# Patient Record
Sex: Female | Born: 1940 | Race: White | Hispanic: No | Smoking: Current every day smoker
Health system: Southern US, Community
[De-identification: ages and names within clinical notes are randomized; demographics above are authoritative.]

## PROBLEM LIST (undated history)

## (undated) DIAGNOSIS — I10 Essential (primary) hypertension: Secondary | ICD-10-CM

## (undated) DIAGNOSIS — M1990 Unspecified osteoarthritis, unspecified site: Secondary | ICD-10-CM

## (undated) DIAGNOSIS — K219 Gastro-esophageal reflux disease without esophagitis: Secondary | ICD-10-CM

## (undated) DIAGNOSIS — IMO0001 Reserved for inherently not codable concepts without codable children: Secondary | ICD-10-CM

## (undated) DIAGNOSIS — F419 Anxiety disorder, unspecified: Secondary | ICD-10-CM

## (undated) DIAGNOSIS — Z720 Tobacco use: Secondary | ICD-10-CM

## (undated) DIAGNOSIS — I499 Cardiac arrhythmia, unspecified: Secondary | ICD-10-CM

## (undated) DIAGNOSIS — M549 Dorsalgia, unspecified: Secondary | ICD-10-CM

## (undated) DIAGNOSIS — Z9289 Personal history of other medical treatment: Secondary | ICD-10-CM

## (undated) HISTORY — PX: OTHER SURGICAL HISTORY: SHX169

## (undated) HISTORY — PX: EYE SURGERY: SHX253

## (undated) HISTORY — PX: TONSILLECTOMY: SUR1361

---

## 2002-12-30 ENCOUNTER — Encounter: Admission: RE | Admit: 2002-12-30 | Discharge: 2002-12-30 | Payer: Self-pay | Admitting: *Deleted

## 2002-12-30 ENCOUNTER — Encounter: Payer: Self-pay | Admitting: *Deleted

## 2007-05-30 ENCOUNTER — Other Ambulatory Visit: Admission: RE | Admit: 2007-05-30 | Discharge: 2007-05-30 | Payer: Self-pay | Admitting: Family Medicine

## 2011-02-01 ENCOUNTER — Encounter: Payer: Self-pay | Admitting: Cardiology

## 2011-02-01 NOTE — Progress Notes (Signed)
HPI: 70 yo female for evaluation of   No current outpatient prescriptions on file.    Allergies not on file  No past medical history on file.  No past surgical history on file.  History   Social History  . Marital Status: Divorced    Spouse Name: N/A    Number of Children: N/A  . Years of Education: N/A   Occupational History  . Not on file.   Social History Main Topics  . Smoking status: Not on file  . Smokeless tobacco: Not on file  . Alcohol Use: Not on file  . Drug Use: Not on file  . Sexually Active: Not on file   Other Topics Concern  . Not on file   Social History Narrative  . No narrative on file    No family history on file.  ROS: no fevers or chills, productive cough, hemoptysis, dysphasia, odynophagia, melena, hematochezia, dysuria, hematuria, rash, seizure activity, orthopnea, PND, pedal edema, claudication. Remaining systems are negative.  Physical Exam: General:  Well developed/well nourished in NAD Skin warm/dry Patient not depressed No peripheral clubbing Back-normal HEENT-normal/normal eyelids Neck supple/normal carotid upstroke bilaterally; no bruits; no JVD; no thyromegaly chest - CTA/ normal expansion CV - RRR/normal S1 and S2; no murmurs, rubs or gallops;  PMI nondisplaced Abdomen -NT/ND, no HSM, no mass, + bowel sounds, no bruit 2+ femoral pulses, no bruits Ext-no edema, chords, 2+ DP Neuro-grossly nonfocal  ECG      This encounter was created in error - please disregard.

## 2011-02-21 ENCOUNTER — Encounter: Payer: Self-pay | Admitting: Cardiology

## 2011-08-25 ENCOUNTER — Other Ambulatory Visit: Payer: Self-pay | Admitting: Family Medicine

## 2011-08-25 DIAGNOSIS — Z1231 Encounter for screening mammogram for malignant neoplasm of breast: Secondary | ICD-10-CM

## 2011-12-20 ENCOUNTER — Emergency Department (HOSPITAL_COMMUNITY): Payer: Medicare HMO

## 2011-12-20 ENCOUNTER — Emergency Department (HOSPITAL_COMMUNITY)
Admission: EM | Admit: 2011-12-20 | Discharge: 2011-12-21 | Disposition: A | Discharge status: Home / Self Care | Payer: Medicare HMO | Attending: Emergency Medicine | Admitting: Emergency Medicine

## 2011-12-20 ENCOUNTER — Encounter (HOSPITAL_COMMUNITY): Payer: Self-pay | Admitting: Emergency Medicine

## 2011-12-20 ENCOUNTER — Other Ambulatory Visit: Payer: Self-pay

## 2011-12-20 DIAGNOSIS — R1013 Epigastric pain: Secondary | ICD-10-CM | POA: Insufficient documentation

## 2011-12-20 DIAGNOSIS — Z7982 Long term (current) use of aspirin: Secondary | ICD-10-CM | POA: Insufficient documentation

## 2011-12-20 DIAGNOSIS — Z79899 Other long term (current) drug therapy: Secondary | ICD-10-CM | POA: Insufficient documentation

## 2011-12-20 DIAGNOSIS — R079 Chest pain, unspecified: Secondary | ICD-10-CM | POA: Insufficient documentation

## 2011-12-20 DIAGNOSIS — IMO0001 Reserved for inherently not codable concepts without codable children: Secondary | ICD-10-CM | POA: Insufficient documentation

## 2011-12-20 DIAGNOSIS — F17200 Nicotine dependence, unspecified, uncomplicated: Secondary | ICD-10-CM | POA: Insufficient documentation

## 2011-12-20 DIAGNOSIS — R0602 Shortness of breath: Secondary | ICD-10-CM | POA: Insufficient documentation

## 2011-12-20 HISTORY — DX: Reserved for inherently not codable concepts without codable children: IMO0001

## 2011-12-20 LAB — CBC
Hemoglobin: 13.3 g/dL (ref 12.0–15.0)
MCHC: 32.2 g/dL (ref 30.0–36.0)
Platelets: 264 10*3/uL (ref 150–400)

## 2011-12-20 LAB — BASIC METABOLIC PANEL
GFR calc Af Amer: >90 mL/min (ref >90)
GFR calc non Af Amer: 83 mL/min — ABNORMAL LOW (ref >90)
Glucose, Bld: 109 mg/dL — ABNORMAL HIGH (ref 70–99)
Potassium: 3.5 mEq/L (ref 3.5–5.1)
Sodium: 141 mEq/L (ref 135–145)

## 2011-12-20 LAB — POCT I-STAT TROPONIN I: Troponin i, poc: 0.01 ng/mL (ref 0.00–0.08)

## 2011-12-20 NOTE — ED Notes (Addendum)
Patient complaining of chest pain that started this evening; pain began in epigastric region and radiated upwards; patient was evaluated by EMS (patient felt that she would rather drive to the hospital). Patient describes pain as a "pressure"; reports shortness of breath; denies other associated symptoms.  Chest pain is now in left side of chest.  Patient denies cardiac history, though had a stress test done 6 years ago.  EKG done in triage.

## 2011-12-21 MED ORDER — ASPIRIN 81 MG PO CHEW
324.0000 mg | CHEWABLE_TABLET | Freq: Once | ORAL | Status: AC
  Administered 2011-12-21: 324 mg via ORAL
  Filled 2011-12-21: qty 4

## 2011-12-21 NOTE — Discharge Instructions (Signed)
Your EKG, and blood tests do not show any signs of a heart attack or irregular heartbeat.  Take an aspirin daily.  Stop smoking.  Call your physician.  This morning after 8:30 to arrange for reevaluation in the office.  Return for worse or uncontrolled symptoms.

## 2011-12-21 NOTE — ED Provider Notes (Addendum)
History     CSN: [id number]  Arrival date & time 12/20/11  2212   First MD Initiated Contact with Patient 12/21/11 0006      Chief Complaint  Patient presents with  . Chest Pain    (Consider location/radiation/quality/duration/timing/severity/associated sxs/prior treatment) Patient is a 71 y.o. female presenting with chest pain. The history is provided by the patient.  Chest Pain Primary symptoms include shortness of breath. Pertinent negatives for primary symptoms include no fever, no cough, no nausea and no vomiting.  Pertinent negatives for associated symptoms include no diaphoresis.    the patient is a 71 year old, female, who smokes cigarettes, but has no past medical history, who presents to emergency department complaining of epigastric pain that began while she was eating dinner.  She said it was so severe that she had shortness of breath, as well.  She denies nausea, vomiting, or diaphoresis.  She has not had similar symptoms in the past.  She states that she has stairs in her house, when she goes up and down the stairs.  She does not get chest pain, or shortness of breath.  She also walks about 3 times day and does not get chest pain with ambulation.  She called the ambulance.  They gave her oxygen and her pain dissipated.  Presently, she just has mild soreness in her chest.  He denies any other symptoms.  Past Medical History  Diagnosis Date  . No significant past medical history     History reviewed. No pertinent past surgical history.  History reviewed. No pertinent family history.  History  Substance Use Topics  . Smoking status: Current Everyday Smoker -- 0.5 packs/day  . Smokeless tobacco: Not on file  . Alcohol Use: Yes     Occassional Use    OB History    Grav Para Term Preterm Abortions TAB SAB Ect Mult Living                  Review of Systems  Constitutional: Negative for fever, chills and diaphoresis.  Respiratory: Positive for shortness of breath.  Negative for cough.   Cardiovascular: Positive for chest pain. Negative for leg swelling.  Gastrointestinal: Negative for nausea and vomiting.  All other systems reviewed and are negative.    Allergies  Sulfa antibiotics  Home Medications   Current Outpatient Rx  Name Route Sig Dispense Refill  . ASPIRIN EC 81 MG PO TBEC Oral Take 81 mg by mouth daily.    Marland Kitchen LANSOPRAZOLE 30 MG PO CPDR Oral Take 30 mg by mouth daily.      BP 139/69  Pulse 77  Temp(Src) 97.9 F (36.6 C) (Oral)  Resp 16  SpO2 98%  Physical Exam  Vitals reviewed. Constitutional: She is oriented to person, place, and time. She appears well-developed and well-nourished.  HENT:  Head: Normocephalic and atraumatic.  Eyes: Pupils are equal, round, and reactive to light.  Neck: Normal range of motion.  Cardiovascular: Normal rate, regular rhythm and normal heart sounds.   No murmur heard. Pulmonary/Chest: Effort normal and breath sounds normal. No respiratory distress. She has no wheezes. She has no rales.  Abdominal: Soft. She exhibits no distension and no mass. There is no tenderness. There is no rebound and no guarding.  Musculoskeletal: Normal range of motion. She exhibits no edema and no tenderness.  Neurological: She is alert and oriented to person, place, and time. No cranial nerve deficit.  Skin: Skin is warm and dry. No rash noted. No erythema.  Psychiatric: She has a normal mood and affect. Her behavior is normal.    ED Course  Procedures (including critical care time) 71 year old, female, who smokes cigarettes, presents to emergency department complaining of postprandial epigastric pain and shortness of breath.  Due to the severity of the pain.  She is pain-free now and has a negative EKG, and cardiac enzymes.  I do not think her symptoms are attributable to her heart.  I spoke with the physician on call act.  [facility].  She agrees that I can discharge.  The patient and have her followup in  the office tomorrow morning.  I explained this to the patient and her daughter and they agree with the plan to  Labs Reviewed  CBC - Abnormal; Notable for the following:    WBC 11.9 (*)    All other components within normal limits  BASIC METABOLIC PANEL - Abnormal; Notable for the following:    Glucose, Bld 109 (*)    GFR calc non Af Amer 83 (*)    All other components within normal limits  POCT I-STAT TROPONIN I   Dg Chest 2 View  12/20/2011  *RADIOLOGY REPORT*  Clinical Data: Left-sided chest pain.  CHEST - 2 VIEW  Comparison: None.  Findings: Heart size and vascularity are normal and the lungs are clear.  Slight thoracolumbar scoliosis.  No acute osseous abnormality.  IMPRESSION: No acute disease.  Original Report Authenticated By: Gwynn Burly, M.D.     No diagnosis found.   Date: 12/20/2011  Rate: 75  Rhythm: normal sinus rhythm  QRS Axis: normal  Intervals: normal  ST/T Wave abnormalities: normal  Conduction Disutrbances: none  Narrative Interpretation:   Old EKG Reviewed:  1:00 AM Spoke with Dr. Clyde Canterbury.  She agrees with plan for f/u in office.   MDM  Postprandial epigastric pain in a 71 year old smoker with no evidence of acute coronary syndrome.  On her EKG or blood tests.  She is very active and has not felt any chest pain.  I have low suspicion that this is related to her heart.  She will followup in the office with her primary care physician, tomorrow.        Nicholes Stairs, MD 12/21/11 0100  Nicholes Stairs, MD 12/21/11 1308

## 2012-01-01 ENCOUNTER — Other Ambulatory Visit: Payer: Self-pay | Admitting: Gastroenterology

## 2012-01-08 ENCOUNTER — Ambulatory Visit
Admission: RE | Admit: 2012-01-08 | Discharge: 2012-01-08 | Disposition: A | Discharge status: Home / Self Care | Payer: Medicare HMO | Source: Ambulatory Visit | Attending: Gastroenterology | Admitting: Gastroenterology

## 2012-02-27 ENCOUNTER — Other Ambulatory Visit: Payer: Self-pay | Admitting: Family Medicine

## 2012-02-27 ENCOUNTER — Ambulatory Visit
Admission: RE | Admit: 2012-02-27 | Discharge: 2012-02-27 | Disposition: A | Discharge status: Home / Self Care | Payer: Medicare HMO | Source: Ambulatory Visit | Attending: Family Medicine | Admitting: Family Medicine

## 2012-02-27 DIAGNOSIS — M545 Low back pain: Secondary | ICD-10-CM

## 2012-02-27 DIAGNOSIS — M79605 Pain in left leg: Secondary | ICD-10-CM

## 2012-03-06 ENCOUNTER — Other Ambulatory Visit: Payer: Self-pay | Admitting: Family Medicine

## 2012-03-06 DIAGNOSIS — M5416 Radiculopathy, lumbar region: Secondary | ICD-10-CM

## 2012-03-06 DIAGNOSIS — M545 Low back pain: Secondary | ICD-10-CM

## 2012-03-06 DIAGNOSIS — R200 Anesthesia of skin: Secondary | ICD-10-CM

## 2012-03-06 DIAGNOSIS — M79606 Pain in leg, unspecified: Secondary | ICD-10-CM

## 2012-03-12 ENCOUNTER — Ambulatory Visit
Admission: RE | Admit: 2012-03-12 | Discharge: 2012-03-12 | Disposition: A | Discharge status: Home / Self Care | Payer: Medicare HMO | Source: Ambulatory Visit | Attending: Family Medicine | Admitting: Family Medicine

## 2012-03-12 DIAGNOSIS — M545 Low back pain: Secondary | ICD-10-CM

## 2012-03-12 DIAGNOSIS — R200 Anesthesia of skin: Secondary | ICD-10-CM

## 2012-03-12 DIAGNOSIS — M5416 Radiculopathy, lumbar region: Secondary | ICD-10-CM

## 2012-03-12 DIAGNOSIS — M79606 Pain in leg, unspecified: Secondary | ICD-10-CM

## 2012-03-13 ENCOUNTER — Other Ambulatory Visit: Payer: Medicare HMO

## 2012-06-03 ENCOUNTER — Encounter (HOSPITAL_COMMUNITY): Payer: Self-pay | Admitting: Pharmacy Technician

## 2012-06-10 ENCOUNTER — Other Ambulatory Visit (HOSPITAL_COMMUNITY): Payer: Self-pay | Admitting: Orthopedic Surgery

## 2012-06-10 ENCOUNTER — Encounter (HOSPITAL_COMMUNITY): Payer: Self-pay

## 2012-06-10 ENCOUNTER — Encounter (HOSPITAL_COMMUNITY)
Admission: RE | Admit: 2012-06-10 | Discharge: 2012-06-10 | Disposition: A | Discharge status: Home / Self Care | Payer: Medicare HMO | Source: Ambulatory Visit | Attending: Orthopaedic Surgery | Admitting: Orthopaedic Surgery

## 2012-06-10 HISTORY — DX: Gastro-esophageal reflux disease without esophagitis: K21.9

## 2012-06-10 HISTORY — DX: Anxiety disorder, unspecified: F41.9

## 2012-06-10 HISTORY — DX: Unspecified osteoarthritis, unspecified site: M19.90

## 2012-06-10 HISTORY — DX: Cardiac arrhythmia, unspecified: I49.9

## 2012-06-10 HISTORY — DX: Personal history of other medical treatment: Z92.89

## 2012-06-10 LAB — CBC
HCT: 45.8 % (ref 36.0–46.0)
Hemoglobin: 15.1 g/dL — ABNORMAL HIGH (ref 12.0–15.0)
MCH: 28 pg (ref 26.0–34.0)
MCV: 84.8 fL (ref 78.0–100.0)
Platelets: 251 10*3/uL (ref 150–400)
RBC: 5.4 MIL/uL — ABNORMAL HIGH (ref 3.87–5.11)
WBC: 9.2 10*3/uL (ref 4.0–10.5)

## 2012-06-10 LAB — COMPREHENSIVE METABOLIC PANEL
AST: 26 U/L (ref 0–37)
CO2: 26 mEq/L (ref 19–32)
Chloride: 102 mEq/L (ref 96–112)
Creatinine, Ser: 0.79 mg/dL (ref 0.50–1.10)
GFR calc Af Amer: >90 mL/min (ref >90)
GFR calc non Af Amer: 82 mL/min — ABNORMAL LOW (ref >90)
Glucose, Bld: 81 mg/dL (ref 70–99)
Total Bilirubin: 0.5 mg/dL (ref 0.3–1.2)

## 2012-06-10 LAB — PROTIME-INR: INR: 1.03 (ref 0.00–1.49)

## 2012-06-10 NOTE — Pre-Procedure Instructions (Signed)
20 Michele Lewis  06/10/2012   Your procedure is scheduled on:  06/14/2012  Report to [facility] at 08:45 AM.  Call this number if you have problems the morning of surgery: [phone]   Remember:   Do not eat food or drink liquid :After Midnight.      Take these medicines the morning of surgery with A SIP OF WATER: NOTHING with exception of pain medicine if needed    Do not wear jewelry, make-up or nail polish.  Do not wear lotions, powders, or perfumes. You may wear deodorant.  Do not shave 48 hours prior to surgery. Men may shave face and neck.  Do not bring valuables to the hospital.  Contacts, dentures or bridgework may not be worn into surgery.  Leave suitcase in the car. After surgery it may be brought to your room.  For patients admitted to the hospital, checkout time is 11:00 AM the day of discharge.   Patients discharged the day of surgery will not be allowed to drive home.  Name and phone number of your driver: /w daughter   Special Instructions: CHG Shower Use Special Wash: 1/2 bottle night before surgery and 1/2 bottle morning of surgery.   Please read over the following fact sheets that you were given: Pain Booklet, Coughing and Deep Breathing, MRSA Information and Surgical Site Infection Prevention

## 2012-06-13 MED ORDER — CEFAZOLIN SODIUM-DEXTROSE 2-3 GM-% IV SOLR
2.0000 g | INTRAVENOUS | Status: AC
  Administered 2012-06-14: 2 g via INTRAVENOUS
  Filled 2012-06-13: qty 50

## 2012-06-14 ENCOUNTER — Encounter (HOSPITAL_COMMUNITY)
Admission: RE | Disposition: A | Discharge status: Home / Self Care | Payer: Self-pay | Source: Ambulatory Visit | Attending: Orthopaedic Surgery

## 2012-06-14 ENCOUNTER — Encounter (HOSPITAL_COMMUNITY): Payer: Self-pay | Admitting: *Deleted

## 2012-06-14 ENCOUNTER — Observation Stay (HOSPITAL_COMMUNITY)
Admission: RE | Admit: 2012-06-14 | Discharge: 2012-06-15 | DRG: 491 | Disposition: A | Discharge status: Home / Self Care | Payer: Medicare HMO | Source: Ambulatory Visit | Attending: Orthopaedic Surgery | Admitting: Orthopaedic Surgery

## 2012-06-14 ENCOUNTER — Encounter (HOSPITAL_COMMUNITY): Payer: Self-pay | Admitting: Certified Registered"

## 2012-06-14 ENCOUNTER — Ambulatory Visit (HOSPITAL_COMMUNITY): Payer: Medicare HMO

## 2012-06-14 ENCOUNTER — Ambulatory Visit (HOSPITAL_COMMUNITY): Payer: Medicare HMO | Admitting: Certified Registered"

## 2012-06-14 DIAGNOSIS — Z23 Encounter for immunization: Secondary | ICD-10-CM | POA: Insufficient documentation

## 2012-06-14 DIAGNOSIS — Z01812 Encounter for preprocedural laboratory examination: Secondary | ICD-10-CM | POA: Insufficient documentation

## 2012-06-14 DIAGNOSIS — M5126 Other intervertebral disc displacement, lumbar region: Principal | ICD-10-CM | POA: Diagnosis present

## 2012-06-14 DIAGNOSIS — F17200 Nicotine dependence, unspecified, uncomplicated: Secondary | ICD-10-CM | POA: Insufficient documentation

## 2012-06-14 HISTORY — PX: LUMBAR LAMINECTOMY: SHX95

## 2012-06-14 SURGERY — MICRODISCECTOMY LUMBAR LAMINECTOMY
Anesthesia: General | Site: Back | Laterality: Left | Wound class: Clean

## 2012-06-14 MED ORDER — ONDANSETRON HCL 4 MG/2ML IJ SOLN
INTRAMUSCULAR | Status: DC | PRN
  Administered 2012-06-14: 4 mg via INTRAVENOUS

## 2012-06-14 MED ORDER — ACETAMINOPHEN 10 MG/ML IV SOLN
INTRAVENOUS | Status: DC | PRN
  Administered 2012-06-14: 1000 mg via INTRAVENOUS

## 2012-06-14 MED ORDER — SENNOSIDES-DOCUSATE SODIUM 8.6-50 MG PO TABS: 1.0000 | ORAL_TABLET | Freq: Every evening | ORAL | Status: DC | PRN

## 2012-06-14 MED ORDER — ZOLPIDEM TARTRATE 5 MG PO TABS: 5.0000 mg | ORAL_TABLET | Freq: Every evening | ORAL | Status: DC | PRN

## 2012-06-14 MED ORDER — OXYCODONE-ACETAMINOPHEN 5-325 MG PO TABS
1.0000 | ORAL_TABLET | ORAL | Status: DC | PRN
  Administered 2012-06-14 – 2012-06-15 (×2): 2 via ORAL
  Filled 2012-06-14: qty 2

## 2012-06-14 MED ORDER — METHOCARBAMOL 500 MG PO TABS
ORAL_TABLET | ORAL | Status: AC
  Filled 2012-06-14: qty 1

## 2012-06-14 MED ORDER — SODIUM CHLORIDE 0.9 % IJ SOLN
3.0000 mL | Freq: Two times a day (BID) | INTRAMUSCULAR | Status: DC
  Administered 2012-06-14: 3 mL via INTRAVENOUS

## 2012-06-14 MED ORDER — GLYCOPYRROLATE 0.2 MG/ML IJ SOLN
INTRAMUSCULAR | Status: DC | PRN
  Administered 2012-06-14: .8 mg via INTRAVENOUS

## 2012-06-14 MED ORDER — TRAMADOL HCL 50 MG PO TABS: 50.0000 mg | ORAL_TABLET | Freq: Three times a day (TID) | ORAL | Status: DC | PRN

## 2012-06-14 MED ORDER — CEFAZOLIN SODIUM 1-5 GM-% IV SOLN
1.0000 g | Freq: Three times a day (TID) | INTRAVENOUS | Status: AC
  Administered 2012-06-14 – 2012-06-15 (×2): 1 g via INTRAVENOUS
  Filled 2012-06-14 (×2): qty 50

## 2012-06-14 MED ORDER — METHOCARBAMOL 100 MG/ML IJ SOLN
500.0000 mg | Freq: Four times a day (QID) | INTRAVENOUS | Status: DC | PRN
  Filled 2012-06-14: qty 5

## 2012-06-14 MED ORDER — LACTATED RINGERS IV SOLN
INTRAVENOUS | Status: DC | PRN
  Administered 2012-06-14 (×3): via INTRAVENOUS

## 2012-06-14 MED ORDER — MORPHINE SULFATE 2 MG/ML IJ SOLN: 1.0000 mg | INTRAMUSCULAR | Status: DC | PRN

## 2012-06-14 MED ORDER — HYDROMORPHONE HCL PF 1 MG/ML IJ SOLN
INTRAMUSCULAR | Status: AC
  Filled 2012-06-14: qty 1

## 2012-06-14 MED ORDER — PROMETHAZINE HCL 25 MG/ML IJ SOLN
6.2500 mg | INTRAMUSCULAR | Status: DC | PRN
  Administered 2012-06-14: 6.25 mg via INTRAVENOUS

## 2012-06-14 MED ORDER — OXYCODONE-ACETAMINOPHEN 5-325 MG PO TABS
ORAL_TABLET | ORAL | Status: AC
  Filled 2012-06-14: qty 2

## 2012-06-14 MED ORDER — EPHEDRINE SULFATE 50 MG/ML IJ SOLN
INTRAMUSCULAR | Status: DC | PRN
  Administered 2012-06-14 (×3): 5 mg via INTRAVENOUS

## 2012-06-14 MED ORDER — 0.9 % SODIUM CHLORIDE (POUR BTL) OPTIME
TOPICAL | Status: DC | PRN
  Administered 2012-06-14: 1000 mL

## 2012-06-14 MED ORDER — THROMBIN 20000 UNITS EX SOLR
CUTANEOUS | Status: AC
  Filled 2012-06-14: qty 20000

## 2012-06-14 MED ORDER — ROCURONIUM BROMIDE 100 MG/10ML IV SOLN
INTRAVENOUS | Status: DC | PRN
  Administered 2012-06-14: 40 mg via INTRAVENOUS
  Administered 2012-06-14: 10 mg via INTRAVENOUS

## 2012-06-14 MED ORDER — ACETAMINOPHEN 325 MG PO TABS: 650.0000 mg | ORAL_TABLET | ORAL | Status: DC | PRN

## 2012-06-14 MED ORDER — BUPIVACAINE HCL (PF) 0.25 % IJ SOLN
INTRAMUSCULAR | Status: DC | PRN
  Administered 2012-06-14: 30 mL

## 2012-06-14 MED ORDER — FENTANYL CITRATE 0.05 MG/ML IJ SOLN
INTRAMUSCULAR | Status: DC | PRN
  Administered 2012-06-14: 50 ug via INTRAVENOUS
  Administered 2012-06-14: 150 ug via INTRAVENOUS
  Administered 2012-06-14: 50 ug via INTRAVENOUS
  Administered 2012-06-14: 25 ug via INTRAVENOUS
  Administered 2012-06-14 (×2): 50 ug via INTRAVENOUS

## 2012-06-14 MED ORDER — FENTANYL CITRATE 0.05 MG/ML IJ SOLN: 50.0000 ug | INTRAMUSCULAR | Status: DC | PRN

## 2012-06-14 MED ORDER — DOCUSATE SODIUM 100 MG PO CAPS
100.0000 mg | ORAL_CAPSULE | Freq: Two times a day (BID) | ORAL | Status: DC
  Administered 2012-06-14: 100 mg via ORAL
  Filled 2012-06-14 (×3): qty 1

## 2012-06-14 MED ORDER — PNEUMOCOCCAL VAC POLYVALENT 25 MCG/0.5ML IJ INJ
0.5000 mL | INJECTION | INTRAMUSCULAR | Status: AC
  Administered 2012-06-15: 0.5 mL via INTRAMUSCULAR
  Filled 2012-06-14: qty 0.5

## 2012-06-14 MED ORDER — MENTHOL 3 MG MT LOZG: 1.0000 | LOZENGE | OROMUCOSAL | Status: DC | PRN

## 2012-06-14 MED ORDER — MIDAZOLAM HCL 5 MG/5ML IJ SOLN
INTRAMUSCULAR | Status: DC | PRN
  Administered 2012-06-14: 2 mg via INTRAVENOUS

## 2012-06-14 MED ORDER — KETOROLAC TROMETHAMINE 30 MG/ML IJ SOLN
30.0000 mg | Freq: Three times a day (TID) | INTRAMUSCULAR | Status: DC
  Administered 2012-06-14 – 2012-06-15 (×2): 30 mg via INTRAVENOUS
  Filled 2012-06-14 (×5): qty 1

## 2012-06-14 MED ORDER — MIDAZOLAM HCL 2 MG/2ML IJ SOLN: 1.0000 mg | INTRAMUSCULAR | Status: DC | PRN

## 2012-06-14 MED ORDER — HYDROMORPHONE HCL PF 1 MG/ML IJ SOLN
0.2500 mg | INTRAMUSCULAR | Status: DC | PRN
  Administered 2012-06-14 (×2): 0.5 mg via INTRAVENOUS

## 2012-06-14 MED ORDER — PROMETHAZINE HCL 25 MG/ML IJ SOLN
INTRAMUSCULAR | Status: AC
  Filled 2012-06-14: qty 1

## 2012-06-14 MED ORDER — SODIUM CHLORIDE 0.9 % IJ SOLN: 3.0000 mL | INTRAMUSCULAR | Status: DC | PRN

## 2012-06-14 MED ORDER — HYDROCODONE-ACETAMINOPHEN 5-325 MG PO TABS: 1.0000 | ORAL_TABLET | ORAL | Status: DC | PRN

## 2012-06-14 MED ORDER — ASPIRIN EC 81 MG PO TBEC
81.0000 mg | DELAYED_RELEASE_TABLET | Freq: Every day | ORAL | Status: DC
  Administered 2012-06-15: 81 mg via ORAL
  Filled 2012-06-14: qty 1

## 2012-06-14 MED ORDER — NEOSTIGMINE METHYLSULFATE 1 MG/ML IJ SOLN
INTRAMUSCULAR | Status: DC | PRN
  Administered 2012-06-14: 5 mg via INTRAVENOUS

## 2012-06-14 MED ORDER — ACETAMINOPHEN 10 MG/ML IV SOLN
INTRAVENOUS | Status: AC
  Filled 2012-06-14: qty 100

## 2012-06-14 MED ORDER — PROPOFOL 10 MG/ML IV EMUL
INTRAVENOUS | Status: DC | PRN
  Administered 2012-06-14: 10 mg via INTRAVENOUS
  Administered 2012-06-14: 150 mg via INTRAVENOUS
  Administered 2012-06-14 (×2): 10 mg via INTRAVENOUS
  Administered 2012-06-14: 50 mg via INTRAVENOUS

## 2012-06-14 MED ORDER — LACTATED RINGERS IV SOLN
INTRAVENOUS | Status: DC
  Administered 2012-06-14: 11:00:00 via INTRAVENOUS

## 2012-06-14 MED ORDER — ONDANSETRON HCL 4 MG/2ML IJ SOLN
4.0000 mg | INTRAMUSCULAR | Status: DC | PRN
Start: 2012-06-14 — End: 2012-06-15

## 2012-06-14 MED ORDER — BUPIVACAINE HCL (PF) 0.25 % IJ SOLN
INTRAMUSCULAR | Status: AC
  Filled 2012-06-14: qty 30

## 2012-06-14 MED ORDER — BISACODYL 10 MG RE SUPP: 10.0000 mg | Freq: Every day | RECTAL | Status: DC | PRN

## 2012-06-14 MED ORDER — PANTOPRAZOLE SODIUM 40 MG PO TBEC: 40.0000 mg | DELAYED_RELEASE_TABLET | Freq: Every day | ORAL | Status: DC

## 2012-06-14 MED ORDER — METHOCARBAMOL 500 MG PO TABS
500.0000 mg | ORAL_TABLET | Freq: Four times a day (QID) | ORAL | Status: DC | PRN
  Administered 2012-06-14: 500 mg via ORAL

## 2012-06-14 MED ORDER — OXYCODONE-ACETAMINOPHEN 5-325 MG PO TABS: 1.0000 | ORAL_TABLET | ORAL | Status: AC | PRN

## 2012-06-14 MED ORDER — SODIUM CHLORIDE 0.9 % IV SOLN: 250.0000 mL | INTRAVENOUS | Status: DC

## 2012-06-14 MED ORDER — ACETAMINOPHEN 650 MG RE SUPP: 650.0000 mg | RECTAL | Status: DC | PRN

## 2012-06-14 MED ORDER — PHENOL 1.4 % MT LIQD
1.0000 | OROMUCOSAL | Status: DC | PRN
  Administered 2012-06-15: 1 via OROMUCOSAL
  Filled 2012-06-14: qty 177

## 2012-06-14 MED ORDER — KCL IN DEXTROSE-NACL 20-5-0.45 MEQ/L-%-% IV SOLN
INTRAVENOUS | Status: DC
  Filled 2012-06-14 (×3): qty 1000

## 2012-06-14 SURGICAL SUPPLY — 52 items
BENZOIN TINCTURE PRP APPL 2/3 (GAUZE/BANDAGES/DRESSINGS) ×2 IMPLANT
BUR ROUND FLUTED 4 SOFT TCH (BURR) IMPLANT
CLOTH BEACON ORANGE TIMEOUT ST (SAFETY) ×2 IMPLANT
CORDS BIPOLAR (ELECTRODE) ×2 IMPLANT
COVER SURGICAL LIGHT HANDLE (MISCELLANEOUS) ×2 IMPLANT
DERMABOND ADVANCED (GAUZE/BANDAGES/DRESSINGS) ×1
DERMABOND ADVANCED .7 DNX12 (GAUZE/BANDAGES/DRESSINGS) ×1 IMPLANT
DRAPE MICROSCOPE LEICA (MISCELLANEOUS) ×2 IMPLANT
DRAPE PROXIMA HALF (DRAPES) ×4 IMPLANT
DRSG EMULSION OIL 3X3 NADH (GAUZE/BANDAGES/DRESSINGS) ×2 IMPLANT
DRSG MEPILEX BORDER 4X8 (GAUZE/BANDAGES/DRESSINGS) ×2 IMPLANT
DURAPREP 26ML APPLICATOR (WOUND CARE) ×2 IMPLANT
ELECT REM PT RETURN 9FT ADLT (ELECTROSURGICAL) ×2
ELECTRODE REM PT RTRN 9FT ADLT (ELECTROSURGICAL) ×1 IMPLANT
GLOVE BIOGEL M STRL SZ7.5 (GLOVE) ×2 IMPLANT
GLOVE BIOGEL PI IND STRL 6.5 (GLOVE) ×1 IMPLANT
GLOVE BIOGEL PI IND STRL 7.5 (GLOVE) ×2 IMPLANT
GLOVE BIOGEL PI IND STRL 8 (GLOVE) ×1 IMPLANT
GLOVE BIOGEL PI INDICATOR 6.5 (GLOVE) ×1
GLOVE BIOGEL PI INDICATOR 7.5 (GLOVE) ×2
GLOVE BIOGEL PI INDICATOR 8 (GLOVE) ×1
GLOVE ECLIPSE 7.0 STRL STRAW (GLOVE) ×4 IMPLANT
GLOVE ORTHO TXT STRL SZ7.5 (GLOVE) ×2 IMPLANT
GLOVE SURG ORTHO 7.0 STRL STRW (GLOVE) ×2 IMPLANT
GOWN PREVENTION PLUS LG XLONG (DISPOSABLE) IMPLANT
GOWN STRL NON-REIN LRG LVL3 (GOWN DISPOSABLE) ×8 IMPLANT
KIT BASIN OR (CUSTOM PROCEDURE TRAY) ×2 IMPLANT
KIT ROOM TURNOVER OR (KITS) ×2 IMPLANT
MANIFOLD NEPTUNE II (INSTRUMENTS) IMPLANT
NDL SUT .5 MAYO 1.404X.05X (NEEDLE) ×1 IMPLANT
NEEDLE HYPO 25GX1X1/2 BEV (NEEDLE) ×2 IMPLANT
NEEDLE MAYO TAPER (NEEDLE) ×1
NEEDLE SPNL 18GX3.5 QUINCKE PK (NEEDLE) ×4 IMPLANT
NS IRRIG 1000ML POUR BTL (IV SOLUTION) ×2 IMPLANT
PACK LAMINECTOMY ORTHO (CUSTOM PROCEDURE TRAY) ×2 IMPLANT
PAD ARMBOARD 7.5X6 YLW CONV (MISCELLANEOUS) ×4 IMPLANT
PATTIES SURGICAL .5 X.5 (GAUZE/BANDAGES/DRESSINGS) ×2 IMPLANT
PATTIES SURGICAL .75X.75 (GAUZE/BANDAGES/DRESSINGS) IMPLANT
SPONGE GAUZE 4X4 12PLY (GAUZE/BANDAGES/DRESSINGS) ×2 IMPLANT
STRIP CLOSURE SKIN 1/2X4 (GAUZE/BANDAGES/DRESSINGS) IMPLANT
SUT VIC AB 2-0 CT1 27 (SUTURE) ×1
SUT VIC AB 2-0 CT1 TAPERPNT 27 (SUTURE) ×1 IMPLANT
SUT VICRYL 0 CT 1 36IN (SUTURE) ×2 IMPLANT
SUT VICRYL 0 TIES 12 18 (SUTURE) ×2 IMPLANT
SUT VICRYL 4-0 PS2 18IN ABS (SUTURE) IMPLANT
SUT VICRYL AB 2 0 TIES (SUTURE) ×2 IMPLANT
SYR 20ML ECCENTRIC (SYRINGE) IMPLANT
SYR CONTROL 10ML LL (SYRINGE) ×2 IMPLANT
THROMBIN TOPICAL 20,000 UNITS IMPLANT
TOWEL OR 17X24 6PK STRL BLUE (TOWEL DISPOSABLE) ×2 IMPLANT
TOWEL OR 17X26 10 PK STRL BLUE (TOWEL DISPOSABLE) ×2 IMPLANT
WATER STERILE IRR 1000ML POUR (IV SOLUTION) ×2 IMPLANT

## 2012-06-14 NOTE — Preoperative (Signed)
Beta Blockers   Reason not to administer Beta Blockers:Not Applicable 

## 2012-06-14 NOTE — Anesthesia Preprocedure Evaluation (Addendum)
Anesthesia Evaluation  Patient identified by MRN, date of birth, ID band Patient awake    Reviewed: Allergy & Precautions, H&P , NPO status , Patient's Chart, lab work & pertinent test results  Airway Mallampati: II TM Distance: >3 FB Neck ROM: Full    Dental  (+) Teeth Intact and Dental Advisory Given   Pulmonary Current Smoker,  breath sounds clear to auscultation  Pulmonary exam normal       Cardiovascular + dysrhythmias Rhythm:Regular Rate:Normal     Neuro/Psych Anxiety    GI/Hepatic GERD-  Medicated,  Endo/Other    Renal/GU      Musculoskeletal   Abdominal   Peds  Hematology   Anesthesia Other Findings   Reproductive/Obstetrics                          Anesthesia Physical Anesthesia Plan  ASA: II  Anesthesia Plan: General   Post-op Pain Management:    Induction: Intravenous  Airway Management Planned: Oral ETT  Additional Equipment:   Intra-op Plan:   Post-operative Plan: Extubation in OR  Informed Consent: I have reviewed the patients History and Physical, chart, labs and discussed the procedure including the risks, benefits and alternatives for the proposed anesthesia with the patient or authorized representative who has indicated his/her understanding and acceptance.     Plan Discussed with: CRNA and Surgeon  Anesthesia Plan Comments:         Anesthesia Quick Evaluation

## 2012-06-14 NOTE — Anesthesia Procedure Notes (Signed)
Procedure Name: Intubation Date/Time: 06/14/2012 11:00 AM Performed by: Sherie Don Pre-anesthesia Checklist: Patient identified, Emergency Drugs available, Suction available, Patient being monitored and Timeout performed Patient Re-evaluated:Patient Re-evaluated prior to inductionOxygen Delivery Method: Circle system utilized Preoxygenation: Pre-oxygenation with 100% oxygen Intubation Type: IV induction Ventilation: Mask ventilation without difficulty Laryngoscope Size: Mac and 4 Grade View: Grade II Tube size: 7.5 mm Number of attempts: 1 Airway Equipment and Method: Stylet Placement Confirmation: ETT inserted through vocal cords under direct vision,  CO2 detector and breath sounds checked- equal and bilateral Secured at: 22 cm Tube secured with: Tape Dental Injury: Teeth and Oropharynx as per pre-operative assessment

## 2012-06-14 NOTE — Anesthesia Postprocedure Evaluation (Signed)
  Anesthesia Post-op Note  Patient: Michele Lewis, Michele Lewis  Procedure(s) Performed: Procedure(s) (LRB): MICRODISCECTOMY LUMBAR LAMINECTOMY (Left)  Patient Location: PACU  Anesthesia Type: General  Level of Consciousness: awake  Airway and Oxygen Therapy: Patient Spontanous Breathing  Post-op Pain: mild  Post-op Assessment: Post-op Vital signs reviewed, Patient's Cardiovascular Status Stable, Respiratory Function Stable, Patent Airway, No signs of Nausea or vomiting and Pain level controlled  Post-op Vital Signs: stable  Complications: No apparent anesthesia complications

## 2012-06-14 NOTE — H&P (Signed)
Michele Lewis is an 71 y.o. female.   Chief Complaint: low back pain and left leg pain HPI: 71yo female with 6 months Hx of left back and left leg pain and weakness with Left L4-5, and Left L5-S1 HNP with compression by 03/12/12 MRI scan . Tx with NSAIDS, medrol dose pack , ESI without relief.    Past Medical History  Diagnosis Date  . No significant past medical history   . History of nuclear stress test     2002- normal   . Anxiety   . Dysrhythmia     every once in a while feel that her heart skips a beat   . GERD (gastroesophageal reflux disease)     reports barium swallow- 12/2011- has evidence of stricture, spasm  . 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 Childbirth x5 - all vaginal    . Eye surgery     cataracts - bilateral removed- /w IOL  . Tonsillectomy     as a child     History reviewed. No pertinent family history. Social History:  reports that she has been smoking.  She does not have any smokeless tobacco history on file. She reports that she drinks alcohol. She reports that she does not use illicit drugs.  Allergies:  Allergies  Allergen Reactions  . Sulfa Antibiotics Other (See Comments)    Get sicker with whatever illness is that it's supposed to treat    Medications Prior to Admission  Medication Sig Dispense Refill  . aspirin EC 81 MG tablet Take 81 mg by mouth daily.      . lansoprazole (PREVACID) 30 MG capsule Take 30 mg by mouth daily with supper.       . naproxen sodium (ANAPROX) 220 MG tablet Take 220 mg by mouth 2 (two) times daily as needed. For pain      . traMADol (ULTRAM) 50 MG tablet Take 50 mg by mouth every 8 (eight) hours as needed. For pain        No results found for this or any previous visit (from the past 48 hour(s)). No results found.  Review of Systems  HENT:       Cataracts, mild  Respiratory:       Hx of pneumonia  Years ago  Cardiovascular:       Recent EKG normal Card Ez neg  ER visit   Gastrointestinal: Positive for heartburn.       Reflux  Genitourinary: Negative.   Musculoskeletal: Positive for back pain.  Psychiatric/Behavioral: The patient is nervous/anxious.     Blood pressure 137/72, pulse 72, temperature 98.6 F (37 C), temperature source Oral, resp. rate 20, SpO2 98.00%. Physical Exam  Constitutional: She appears well-developed and well-nourished. She appears distressed.  HENT:  Head: Normocephalic and atraumatic.  Eyes: Pupils are equal, round, and reactive to light.  Neck: Normal range of motion. Neck supple.  Cardiovascular: Normal rate, regular rhythm and normal heart sounds.        Occas. PAC noted.   Respiratory: Effort normal.  GI: Soft. Bowel sounds are normal. She exhibits no distension. There is no tenderness. There is no rebound and no guarding.  Neurological:       Pos.  Left  SLR at 90 degrees, pos popliteal compression test. Normal heel and toe walking . Decreased sens. Lateral left foot.      Skin: Skin is warm and dry.  Assessment/Plan Left L4-5 and Left L5-S1 HNP with failed conservative Tx. Plan microdiscectomy  Both levels on the left. Risks discussed, reoperation, recurrent HNP, dural tear, infection , bleeding. Possible later fusion. All ?'s answered she requests we proceed.   Doraine Schexnider C 06/14/2012, 10:31 AM

## 2012-06-14 NOTE — Transfer of Care (Signed)
Immediate Anesthesia Transfer of Care Note  Patient: Engineer, agricultural  Procedure(s) Performed: Procedure(s) (LRB): MICRODISCECTOMY LUMBAR LAMINECTOMY (Left)  Patient Location: PACU  Anesthesia Type: General  Level of Consciousness: awake and patient cooperative  Airway & Oxygen Therapy: Patient Spontanous Breathing and Patient connected to face mask oxygen  Post-op Assessment: Report given to PACU RN, Post -op Vital signs reviewed and stable and Patient moving all extremities X 4  Post vital signs: Reviewed and stable  Complications: No apparent anesthesia complications

## 2012-06-14 NOTE — Brief Op Note (Cosign Needed Addendum)
06/14/2012  12:59 PM  PATIENT:  Twanna Hy  71 y.o. female  PRE-OPERATIVE DIAGNOSIS:  Left L4-5, L5-S1 HNP  POST-OPERATIVE DIAGNOSIS:  Left L4-5, L5-S1 HNP  PROCEDURE:  Procedure(s) (LRB): MICRODISCECTOMY LUMBAR LAMINECTOMY (Left) and lateral recess decompression  SURGEON:  Surgeon(s) and Role:    * Eldred Manges, MD - Primary  PHYSICIAN ASSISTANT: Maud Deed PAC  ASSISTANTS: none   ANESTHESIA:   general  EBL:  Total I/O In: 1800 [I.V.:1800] Out: -   BLOOD ADMINISTERED:none  DRAINS: none   LOCAL MEDICATIONS USED:  MARCAINE     SPECIMEN:  No Specimen  DISPOSITION OF SPECIMEN:  N/A  COUNTS:  YES  TOURNIQUET:  * No tourniquets in log *  DICTATION: .Note written in EPIC  PLAN OF CARE: Admit for overnight observation  PATIENT DISPOSITION:  PACU - hemodynamically stable.   Delay start of Pharmacological VTE agent (>24hrs) due to surgical blood loss or risk of bleeding: yes

## 2012-06-14 NOTE — Interval H&P Note (Signed)
History and Physical Interval Note:  06/14/2012 10:40 AM  Michele Lewis  has presented today for surgery, with the diagnosis of Left L4-5, L5-S1 HNP  The various methods of treatment have been discussed with the patient and family. After consideration of risks, benefits and other options for treatment, the patient has consented to  Procedure(s) (LRB): MICRODISCECTOMY LUMBAR LAMINECTOMY (Left) as a surgical intervention .  The patient's history has been reviewed, patient examined, no change in status, stable for surgery.  I have reviewed the patient's chart and labs.  Questions were answered to the patient's satisfaction.     Roxene Alviar C

## 2012-06-15 NOTE — Discharge Summary (Signed)
Physician Discharge Summary  Patient ID: Michele Lewis MRN: [id number] DOB/AGE: 14-Jan-1941 71 y.o.  Admit date: 06/14/2012 Discharge date: 06/15/2012  Admission Diagnoses:left L4-5 and left L5-S1 HNP with lateral recess  And foraminal stenosis  Discharge Diagnoses:  Principal Problem:  *HNP (herniated nucleus pulposus), lumbar   Discharged Condition: good  Hospital Course: underwent surgery with relief of back and leg pain.   Consults: None  Significant Diagnostic Studies:  Treatments: surgery  Left L5 hemilaminectomy with left L4-5 and Left L5-S1 microdiscectomy  Discharge Exam: Blood pressure 118/66, pulse 70, temperature 98.1 F (36.7 C), temperature source Oral, resp. rate 16, height 5\' 5"  (1.651 m), weight 57.153 kg (126 lb), SpO2 96.00%.   Disposition: 01-Home or Self Care   Medication List  As of 06/15/2012  7:23 AM   TAKE these medications         aspirin EC 81 MG tablet   Take 81 mg by mouth daily.      lansoprazole 30 MG capsule   Commonly known as: PREVACID   Take 30 mg by mouth daily with supper.      naproxen sodium 220 MG tablet   Commonly known as: ANAPROX   Take 220 mg by mouth 2 (two) times daily as needed. For pain      oxyCODONE-acetaminophen 5-325 MG per tablet   Commonly known as: PERCOCET/ROXICET   Take 1 tablet by mouth every 4 (four) hours as needed for pain.      traMADol 50 MG tablet   Commonly known as: ULTRAM   Take 50 mg by mouth every 8 (eight) hours as needed. For pain           Follow-up Information    Follow up with Samuel Rittenhouse C, MD. Schedule an appointment as soon as possible for a visit in 1 week.   Contact information:   [facility] [address] [place] [address] [phone]          Signed: Eldred Manges 06/15/2012, 7:23 AM

## 2012-06-15 NOTE — Progress Notes (Signed)
D/c instructions given to patient and daughter. RX x 1 given. Nn hh services or equipment needed. All questions answered. Pt d/c'ed via wheelchair in stable condition

## 2012-06-15 NOTE — Op Note (Signed)
NAMEBERYL, HORNBERGER             ACCOUNT NO.:  [id number]  MEDICAL RECORD NO.:  [id number]  LOCATION:  5N05C                        FACILITY:  [facility]  PHYSICIAN:  Mark C. Ophelia Charter, M.D.    DATE OF BIRTH:  [birth year]  DATE OF PROCEDURE:  06/14/2012 DATE OF DISCHARGE:                              OPERATIVE REPORT   PREOPERATIVE DIAGNOSES:  Left L4-5 and left L5-S1 herniated nucleus pulposus with lateral recess and foraminal stenosis.  POSTOPERATIVE DIAGNOSES:  Left L4-5 and left L5-S1 herniated nucleus pulposus with lateral recess and foraminal stenosis.  PROCEDURE:  Left L5 hemilaminectomy, L4-5 and L5-S1 microdiskectomy and foraminotomy.  SURGEON:  Mark C. Ophelia Charter, MD  ASSISTANT:  Wende Neighbors, PA-C, medically necessary and present for the entire procedure.  ANESTHESIA:  GOT plus Marcaine local.  EBL:  Minimal.  COMPLICATIONS:  None.  INDICATIONS:  A 71 year old female with back pain, left leg pain, persistent for greater than 6 months, treated with anti-inflammatories, narcotic pain medication, epidural steroids, physical therapy, without relief.  MRI scan showed lateral recess foraminal compression with endplate spurring, paracentral and foraminal disk bulge combined with endplate spurring and facet spurring.  PROCEDURE:  After induction of general anesthesia and orotracheal intubation, 2 g of Ancef prophylaxis, the patient was placed prone on chest rolls.  Standard prepping with DuraPrep.  The usual area was squared with towels, Betadine, Steri-Drape application and laminectomy sheet.  Spinal needle was placed at L4-5 and L5-S1.  Cross-table lateral x-ray was taken showing that the needles were slightly high about one- half level.  Time-out procedure was completed.  Incision was made at the midline adjusting for the x-ray and once the lamina was exposed, sacrum was palpated.  Kocher clamp was placed and second x-ray was taken confirming appropriate level.  L5  hemilaminectomy was performed.  There was a large chunks of thick ligament, which were debrided.  Overhanging spurs were debrided.  There was significant lateral recess stenosis for the L4 nerve coming off in L4-5 space and L5 nerve root on the left. Endplate spurs were removed with the teeth pituitary and a Beyer rongeur.  The disk space will not accept the regular pituitary, but micropituitary up, down and straight were used to perform microdiskectomy.  Epstein curettes were used to tease disk space from lateral into the gutter even with the pedicle and then grasped and removed with the micropituitary.  Bone was removed out to the level of the pedicle.  There was considerable overhanging spurs causing compression.  These were removed.  There still were endplates spurs lateral to the facet.  Palpation with the hockey stick and ball-tip nerve hook showed that the nerve was freed up and the areas of significant compression were relieved.  Microdiskectomy was performed at both L4-5 and L5-S1 level.  Initially, left L5 laminotomy was performed, but since there was considerable thick chunks of ligament in the lateral recess overhanging, the left L5 lamina was removed just on the left side completing hemilaminectomy and removing the thick chunks of ligament, which decompressed the nerve root in the lateral recess in this and the entry to the foramina. Foramina was undercut, removing to dorsal portion of overhanging spurs and the  entry to the foramina.  This gave more room for the nerve root. No free fragments were present.  Hockey stick was able to be passed anterior to the dura with no areas of extruded fragments.  There were still some endplate spurring in the midline.  The patient had no right- sided symptoms.  After irrigation with saline solution, standard layered closure with 0 Vicryl in deep fascia, 2-0 Vicryl and 4-0 Vicryl subcuticular closure and some Dermabond was applied for  postoperative dressing.  Instrument count and needle count was correct.  Time-out procedure was completed at the end of the case.  Procedure was as posted.  Two-level microdiskectomy performed on the left at L4-5 and L5- S1.     Mark C. Ophelia Charter, M.D.     MCY/MEDQ  D:  06/14/2012  T:  06/15/2012  Job:  [id number]

## 2012-06-18 ENCOUNTER — Encounter (HOSPITAL_COMMUNITY): Payer: Self-pay | Admitting: Orthopaedic Surgery

## 2014-06-21 ENCOUNTER — Encounter: Payer: Self-pay | Admitting: *Deleted

## 2014-09-23 ENCOUNTER — Ambulatory Visit
Admission: RE | Admit: 2014-09-23 | Discharge: 2014-09-23 | Disposition: A | Discharge status: Home / Self Care | Payer: Commercial Managed Care - HMO | Source: Ambulatory Visit | Attending: Family | Admitting: Family

## 2014-09-23 ENCOUNTER — Other Ambulatory Visit: Payer: Self-pay | Admitting: Family

## 2014-09-23 DIAGNOSIS — R059 Cough, unspecified: Secondary | ICD-10-CM

## 2014-09-23 DIAGNOSIS — F17200 Nicotine dependence, unspecified, uncomplicated: Secondary | ICD-10-CM

## 2014-09-23 DIAGNOSIS — R05 Cough: Secondary | ICD-10-CM

## 2015-02-08 ENCOUNTER — Ambulatory Visit
Admission: RE | Admit: 2015-02-08 | Discharge: 2015-02-08 | Disposition: A | Discharge status: Home / Self Care | Payer: Commercial Managed Care - HMO | Source: Ambulatory Visit | Attending: Family Medicine | Admitting: Family Medicine

## 2015-02-08 ENCOUNTER — Other Ambulatory Visit: Payer: Self-pay | Admitting: Family Medicine

## 2015-02-08 DIAGNOSIS — R05 Cough: Secondary | ICD-10-CM

## 2015-02-08 DIAGNOSIS — R059 Cough, unspecified: Secondary | ICD-10-CM

## 2015-02-08 DIAGNOSIS — R0989 Other specified symptoms and signs involving the circulatory and respiratory systems: Secondary | ICD-10-CM

## 2015-02-08 DIAGNOSIS — J209 Acute bronchitis, unspecified: Secondary | ICD-10-CM | POA: Diagnosis not present

## 2015-06-14 DIAGNOSIS — C44529 Squamous cell carcinoma of skin of other part of trunk: Secondary | ICD-10-CM | POA: Diagnosis not present

## 2015-08-13 ENCOUNTER — Other Ambulatory Visit: Payer: Self-pay | Admitting: Family Medicine

## 2015-08-13 DIAGNOSIS — J069 Acute upper respiratory infection, unspecified: Secondary | ICD-10-CM | POA: Diagnosis not present

## 2015-08-13 DIAGNOSIS — R42 Dizziness and giddiness: Secondary | ICD-10-CM | POA: Diagnosis not present

## 2015-08-13 DIAGNOSIS — R110 Nausea: Secondary | ICD-10-CM | POA: Diagnosis not present

## 2015-08-13 DIAGNOSIS — R8299 Other abnormal findings in urine: Secondary | ICD-10-CM | POA: Diagnosis not present

## 2015-08-13 DIAGNOSIS — R1030 Lower abdominal pain, unspecified: Secondary | ICD-10-CM | POA: Diagnosis not present

## 2015-08-13 DIAGNOSIS — H6590 Unspecified nonsuppurative otitis media, unspecified ear: Secondary | ICD-10-CM | POA: Diagnosis not present

## 2015-08-13 DIAGNOSIS — R1013 Epigastric pain: Secondary | ICD-10-CM | POA: Diagnosis not present

## 2015-08-13 DIAGNOSIS — R1011 Right upper quadrant pain: Secondary | ICD-10-CM | POA: Diagnosis not present

## 2015-08-17 DIAGNOSIS — K824 Cholesterolosis of gallbladder: Secondary | ICD-10-CM | POA: Diagnosis not present

## 2015-08-20 ENCOUNTER — Other Ambulatory Visit: Payer: Commercial Managed Care - HMO

## 2015-08-30 ENCOUNTER — Other Ambulatory Visit: Payer: Self-pay | Admitting: Gastroenterology

## 2015-08-30 DIAGNOSIS — R110 Nausea: Secondary | ICD-10-CM

## 2015-08-30 DIAGNOSIS — R1084 Generalized abdominal pain: Secondary | ICD-10-CM

## 2015-09-07 ENCOUNTER — Ambulatory Visit
Admission: RE | Admit: 2015-09-07 | Discharge: 2015-09-07 | Disposition: A | Discharge status: Home / Self Care | Payer: Commercial Managed Care - HMO | Source: Ambulatory Visit | Attending: Gastroenterology | Admitting: Gastroenterology

## 2015-09-07 DIAGNOSIS — R1084 Generalized abdominal pain: Secondary | ICD-10-CM

## 2015-09-07 DIAGNOSIS — R110 Nausea: Secondary | ICD-10-CM

## 2015-09-07 DIAGNOSIS — K5730 Diverticulosis of large intestine without perforation or abscess without bleeding: Secondary | ICD-10-CM | POA: Diagnosis not present

## 2015-09-07 MED ORDER — IOPAMIDOL (ISOVUE-300) INJECTION 61%
100.0000 mL | Freq: Once | INTRAVENOUS | Status: AC | PRN
  Administered 2015-09-07: 100 mL via INTRAVENOUS

## 2015-09-16 ENCOUNTER — Other Ambulatory Visit: Payer: Self-pay | Admitting: *Deleted

## 2015-09-16 ENCOUNTER — Ambulatory Visit (INDEPENDENT_AMBULATORY_CARE_PROVIDER_SITE_OTHER): Payer: Commercial Managed Care - HMO | Admitting: Emergency Medicine

## 2015-09-16 ENCOUNTER — Encounter: Payer: Self-pay | Admitting: *Deleted

## 2015-09-16 VITALS — BP 108/72 | HR 78 | Ht 65.0 in | Wt 123.0 lb

## 2015-09-16 DIAGNOSIS — R911 Solitary pulmonary nodule: Secondary | ICD-10-CM | POA: Diagnosis not present

## 2015-09-16 DIAGNOSIS — R932 Abnormal findings on diagnostic imaging of liver and biliary tract: Secondary | ICD-10-CM | POA: Insufficient documentation

## 2015-09-16 NOTE — Assessment & Plan Note (Signed)
7 mm pulmonary nodule noted on CT scan of the abdomen. We will perform a dedicated CT chest to further characterize. She will likely need a repeat CT scan of the chest in 6 months to follow for interval change.

## 2015-09-16 NOTE — Patient Instructions (Addendum)
We will perform a CT scan of your chest, no contrast, to evaluate your pulmonary nodule.  We will likely repeat in 6 months.  It will be important to stop smoking.  You will need to follow with Dr Penelope Coop to plan your next testing  Follow with Dr Lamonte Sakai in May 2017 or sooner if needed.

## 2015-09-16 NOTE — Progress Notes (Signed)
Subjective:    Patient ID: Michele Lewis, female    DOB: 04-Aug-1941, 74 y.o.   MRN: [id number]  HPI 74 year old woman with a history of tobacco use (25-pack-years), hx of GERD and arthritis. She was referred today by Dr Penelope Coop for evaluation of an abnormal CT scan of the abdomen that was performed on 09/07/15 for abd pain and nausea. This showed a 7 mm nodule along the right horizontal fissure without any evidence of spiculation. Also showed a L hepatic ductal dilation and an area of hypodensity. I have reviewed the films myself. She denies any breathing sx. She does not cough. She has had fatigue and some decreased appetite, nausea.    Review of Systems  Constitutional: Positive for appetite change, fatigue and unexpected weight change. Negative for fever.  HENT: Negative for congestion, dental problem, ear pain, nosebleeds, postnasal drip, rhinorrhea, sinus pressure, sneezing, sore throat and trouble swallowing.   Eyes: Negative for redness and itching.  Respiratory: Negative for cough, chest tightness, shortness of breath and wheezing.   Cardiovascular: Negative for palpitations and leg swelling.  Gastrointestinal: Positive for nausea. Negative for vomiting.  Genitourinary: Negative for dysuria.  Musculoskeletal: Negative for joint swelling.  Skin: Negative for rash.  Neurological: Negative for headaches.  Hematological: Does not bruise/bleed easily.  Psychiatric/Behavioral: Negative for dysphoric mood. The patient is not nervous/anxious.    Past Medical History  Diagnosis Date  . No significant past medical history   . History of nuclear stress test     2002- normal   . Anxiety   . Dysrhythmia     every once in a while feel that her heart skips a beat   . GERD (gastroesophageal reflux disease)     reports barium swallow- 12/2011- has evidence of stricture, spasm  . 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 Heart disease  Father   . Hypertension Father   . Heart disease Mother   . Coronary artery disease Mother   . Hyperlipidemia Sister   . Hypertension Sister      Social History   Social History  . Marital Status: Divorced    Spouse Name: N/A  . Number of Children: N/A  . Years of Education: N/A   Occupational History  . Not on file.   Social History Main Topics  . Smoking status: Current Every Day Smoker -- 0.50 packs/day for 50 years    Types: Cigarettes  . Smokeless tobacco: Never Used  . Alcohol Use: 0.0 oz/week    0 Standard drinks or equivalent per week     Comment: 1 drink per day  . Drug Use: No  . Sexual Activity: Not on file   Other Topics Concern  . Not on file   Social History Narrative     Allergies  Allergen Reactions  . Sulfa Antibiotics Other (See Comments)    Get sicker with whatever illness is that it's supposed to treat     Outpatient Prescriptions Prior to Visit  Medication Sig Dispense Refill  . aspirin EC 81 MG tablet Take 81 mg by mouth daily.    . lansoprazole (PREVACID) 30 MG capsule Take 30 mg by mouth daily with supper.     . valsartan (DIOVAN) 80 MG tablet Take 1 tablet by mouth daily.    . ondansetron (ZOFRAN-ODT) 4 MG disintegrating tablet Take 1 tablet by mouth every 8 (eight) hours as  needed.    . naproxen sodium (ANAPROX) 220 MG tablet Take 220 mg by mouth 2 (two) times daily as needed. For pain    . traMADol (ULTRAM) 50 MG tablet Take 50 mg by mouth every 8 (eight) hours as needed. For pain     No facility-administered medications prior to visit.         Objective:   Physical Exam Filed Vitals:   09/16/15 1128 09/16/15 1135  BP:  108/72  Pulse:  78  Height: 5\' 5"  (1.651 m)   Weight: 123 lb (55.792 kg)   SpO2:  98%   Gen: Pleasant, well-nourished, in no distress,  normal affect  ENT: No lesions,  mouth clear,  oropharynx clear, no postnasal drip  Neck: No JVD, no TMG, no carotid bruits  Lungs: No use of accessory muscles, no  dullness to percussion, clear without rales or rhonchi  Cardiovascular: RRR, heart sounds normal, no murmur or gallops, no peripheral edema  Musculoskeletal: No deformities, no cyanosis or clubbing  Neuro: alert, non focal  Skin: Warm, no lesions or rashes      Assessment & Plan:  Solitary pulmonary nodule 7 mm pulmonary nodule noted on CT scan of the abdomen. We will perform a dedicated CT chest to further characterize. She will likely need a repeat CT scan of the chest in 6 months to follow for interval change.   Abnormal CT scan, liver Significance of the left lobe hypodensity is unclear. She was asked to get an MRI but was hesitant to do so due to a bad experience in the past. I have asked her to discuss with Dr Penelope Coop - whether there is another alternative or whether she needs to proceed possibly with sedation.

## 2015-09-16 NOTE — Assessment & Plan Note (Signed)
Significance of the left lobe hypodensity is unclear. She was asked to get an MRI but was hesitant to do so due to a bad experience in the past. I have asked her to discuss with Dr Penelope Coop - whether there is another alternative or whether she needs to proceed possibly with sedation.

## 2015-09-27 ENCOUNTER — Ambulatory Visit
Admission: RE | Admit: 2015-09-27 | Discharge: 2015-09-27 | Disposition: A | Discharge status: Home / Self Care | Payer: Commercial Managed Care - HMO | Source: Ambulatory Visit | Attending: Emergency Medicine | Admitting: Emergency Medicine

## 2015-09-27 DIAGNOSIS — R911 Solitary pulmonary nodule: Secondary | ICD-10-CM

## 2015-09-27 DIAGNOSIS — R918 Other nonspecific abnormal finding of lung field: Secondary | ICD-10-CM | POA: Diagnosis not present

## 2015-10-04 ENCOUNTER — Telehealth: Payer: Self-pay | Admitting: Emergency Medicine

## 2015-10-04 NOTE — Telephone Encounter (Signed)
Patient calling to get results of CT scan Attempted to contact patient, Left message for patient to call back.  I see the CT scan results in the chart, but they have not been reviewed by RB.  RB - please review so I know what to tell pt when she calls back.  Thanks.

## 2015-10-05 NOTE — Telephone Encounter (Signed)
Let her know that her 34mm pulmonary nodule is unchanged. There are a few smaller pulmonary nodules in the same region. These are all likely benign but we will plan to follow them with a repeat scan in the future to insure that they are not changing.

## 2015-10-05 NOTE — Telephone Encounter (Signed)
Patient Returned call [phone]

## 2015-10-05 NOTE — Telephone Encounter (Signed)
RB please advise. Thanks.  

## 2015-10-05 NOTE — Telephone Encounter (Signed)
Please advise RB on results. thanks

## 2015-10-06 NOTE — Telephone Encounter (Signed)
[phone], pt cb

## 2015-10-06 NOTE — Telephone Encounter (Signed)
lmtcb x1 for pt. 

## 2015-10-06 NOTE — Telephone Encounter (Signed)
Spoke with pt and notified of results per Dr. Byrum. Pt verbalized understanding and denied any questions.  

## 2015-11-05 DIAGNOSIS — J029 Acute pharyngitis, unspecified: Secondary | ICD-10-CM | POA: Diagnosis not present

## 2015-11-05 DIAGNOSIS — B9789 Other viral agents as the cause of diseases classified elsewhere: Secondary | ICD-10-CM | POA: Diagnosis not present

## 2015-11-05 DIAGNOSIS — R6883 Chills (without fever): Secondary | ICD-10-CM | POA: Diagnosis not present

## 2015-11-05 DIAGNOSIS — J069 Acute upper respiratory infection, unspecified: Secondary | ICD-10-CM | POA: Diagnosis not present

## 2016-01-13 ENCOUNTER — Encounter (HOSPITAL_COMMUNITY): Payer: Self-pay

## 2016-01-13 ENCOUNTER — Emergency Department (HOSPITAL_COMMUNITY)
Admission: EM | Admit: 2016-01-13 | Discharge: 2016-01-13 | Disposition: A | Discharge status: Home / Self Care | Payer: Commercial Managed Care - HMO | Attending: Emergency Medicine | Admitting: Emergency Medicine

## 2016-01-13 ENCOUNTER — Emergency Department (HOSPITAL_COMMUNITY): Payer: Commercial Managed Care - HMO

## 2016-01-13 DIAGNOSIS — K219 Gastro-esophageal reflux disease without esophagitis: Secondary | ICD-10-CM | POA: Insufficient documentation

## 2016-01-13 DIAGNOSIS — Z8659 Personal history of other mental and behavioral disorders: Secondary | ICD-10-CM | POA: Diagnosis not present

## 2016-01-13 DIAGNOSIS — F17210 Nicotine dependence, cigarettes, uncomplicated: Secondary | ICD-10-CM | POA: Diagnosis not present

## 2016-01-13 DIAGNOSIS — I10 Essential (primary) hypertension: Secondary | ICD-10-CM | POA: Diagnosis not present

## 2016-01-13 DIAGNOSIS — Z7982 Long term (current) use of aspirin: Secondary | ICD-10-CM | POA: Insufficient documentation

## 2016-01-13 DIAGNOSIS — M1990 Unspecified osteoarthritis, unspecified site: Secondary | ICD-10-CM | POA: Insufficient documentation

## 2016-01-13 DIAGNOSIS — R0789 Other chest pain: Secondary | ICD-10-CM | POA: Insufficient documentation

## 2016-01-13 DIAGNOSIS — Z79899 Other long term (current) drug therapy: Secondary | ICD-10-CM | POA: Diagnosis not present

## 2016-01-13 DIAGNOSIS — R110 Nausea: Secondary | ICD-10-CM | POA: Diagnosis not present

## 2016-01-13 DIAGNOSIS — Z720 Tobacco use: Secondary | ICD-10-CM | POA: Diagnosis not present

## 2016-01-13 DIAGNOSIS — R079 Chest pain, unspecified: Secondary | ICD-10-CM | POA: Diagnosis not present

## 2016-01-13 LAB — URINALYSIS, ROUTINE W REFLEX MICROSCOPIC
Bilirubin Urine: NEGATIVE
GLUCOSE, UA: NEGATIVE mg/dL
Hgb urine dipstick: NEGATIVE
KETONES UR: NEGATIVE mg/dL
LEUKOCYTES UA: NEGATIVE
NITRITE: NEGATIVE
PH: 6.5 (ref 5.0–8.0)
Protein, ur: NEGATIVE mg/dL
SPECIFIC GRAVITY, URINE: 1.01 (ref 1.005–1.030)

## 2016-01-13 LAB — I-STAT TROPONIN, ED
TROPONIN I, POC: 0 ng/mL (ref 0.00–0.08)
Troponin i, poc: 0 ng/mL (ref 0.00–0.08)

## 2016-01-13 LAB — BASIC METABOLIC PANEL
Anion gap: 14 (ref 5–15)
BUN: 14 mg/dL (ref 6–20)
CHLORIDE: 104 mmol/L (ref 101–111)
CO2: 23 mmol/L (ref 22–32)
CREATININE: 1.05 mg/dL — AB (ref 0.44–1.00)
Calcium: 9.4 mg/dL (ref 8.9–10.3)
GFR calc Af Amer: 59 mL/min — ABNORMAL LOW (ref >60)
GFR calc non Af Amer: 51 mL/min — ABNORMAL LOW (ref >60)
GLUCOSE: 95 mg/dL (ref 65–99)
POTASSIUM: 4.3 mmol/L (ref 3.5–5.1)
SODIUM: 141 mmol/L (ref 135–145)

## 2016-01-13 LAB — CBC
HEMATOCRIT: 41.9 % (ref 36.0–46.0)
HEMOGLOBIN: 13.2 g/dL (ref 12.0–15.0)
MCH: 26.6 pg (ref 26.0–34.0)
MCHC: 31.5 g/dL (ref 30.0–36.0)
MCV: 84.5 fL (ref 78.0–100.0)
Platelets: 264 10*3/uL (ref 150–400)
RBC: 4.96 MIL/uL (ref 3.87–5.11)
RDW: 13.4 % (ref 11.5–15.5)
WBC: 8 10*3/uL (ref 4.0–10.5)

## 2016-01-13 MED ORDER — PANTOPRAZOLE SODIUM 40 MG PO TBEC
40.0000 mg | DELAYED_RELEASE_TABLET | Freq: Every day | ORAL | Status: DC
  Administered 2016-01-13: 40 mg via ORAL
  Filled 2016-01-13: qty 1

## 2016-01-13 MED ORDER — ASPIRIN 81 MG PO CHEW
243.0000 mg | CHEWABLE_TABLET | Freq: Once | ORAL | Status: AC
  Administered 2016-01-13: 243 mg via ORAL
  Filled 2016-01-13: qty 3

## 2016-01-13 NOTE — Discharge Instructions (Signed)
Your work up for your chest pain was negative today for heart attack. Please follow up with your primary care doctor for further managament. You may need a repeat stress test.   Nonspecific Chest Pain  Chest pain can be caused by many different conditions. There is always a chance that your pain could be related to something serious, such as a heart attack or a blood clot in your lungs. Chest pain can also be caused by conditions that are not life-threatening. If you have chest pain, it is very important to follow up with your health care provider. CAUSES  Chest pain can be caused by:  Heartburn.  Pneumonia or bronchitis.  Anxiety or stress.  Inflammation around your heart (pericarditis) or lung (pleuritis or pleurisy).  A blood clot in your lung.  A collapsed lung (pneumothorax). It can develop suddenly on its own (spontaneous pneumothorax) or from trauma to the chest.  Shingles infection (varicella-zoster virus).  Heart attack.  Damage to the bones, muscles, and cartilage that make up your chest wall. This can include:  Bruised bones due to injury.  Strained muscles or cartilage due to frequent or repeated coughing or overwork.  Fracture to one or more ribs.  Sore cartilage due to inflammation (costochondritis). RISK FACTORS  Risk factors for chest pain may include:  Activities that increase your risk for trauma or injury to your chest.  Respiratory infections or conditions that cause frequent coughing.  Medical conditions or overeating that can cause heartburn.  Heart disease or family history of heart disease.  Conditions or health behaviors that increase your risk of developing a blood clot.  Having had chicken pox (varicella zoster). SIGNS AND SYMPTOMS Chest pain can feel like:  Burning or tingling on the surface of your chest or deep in your chest.  Crushing, pressure, aching, or squeezing pain.  Dull or sharp pain that is worse when you move, cough, or  take a deep breath.  Pain that is also felt in your back, neck, shoulder, or arm, or pain that spreads to any of these areas. Your chest pain may come and go, or it may stay constant. DIAGNOSIS Lab tests or other studies may be needed to find the cause of your pain. Your health care provider may have you take a test called an ambulatory ECG (electrocardiogram). An ECG records your heartbeat patterns at the time the test is performed. You may also have other tests, such as:  Transthoracic echocardiogram (TTE). During echocardiography, sound waves are used to create a picture of all of the heart structures and to look at how blood flows through your heart.  Transesophageal echocardiogram (TEE).This is a more advanced imaging test that obtains images from inside your body. It allows your health care provider to see your heart in finer detail.  Cardiac monitoring. This allows your health care provider to monitor your heart rate and rhythm in real time.  Holter monitor. This is a portable device that records your heartbeat and can help to diagnose abnormal heartbeats. It allows your health care provider to track your heart activity for several days, if needed.  Stress tests. These can be done through exercise or by taking medicine that makes your heart beat more quickly.  Blood tests.  Imaging tests. TREATMENT  Your treatment depends on what is causing your chest pain. Treatment may include:  Medicines. These may include:  Acid blockers for heartburn.  Anti-inflammatory medicine.  Pain medicine for inflammatory conditions.  Antibiotic medicine, if an infection  is present.  Medicines to dissolve blood clots.  Medicines to treat coronary artery disease.  Supportive care for conditions that do not require medicines. This may include:  Resting.  Applying heat or cold packs to injured areas.  Limiting activities until pain decreases. HOME CARE INSTRUCTIONS  If you were prescribed  an antibiotic medicine, finish it all even if you start to feel better.  Avoid any activities that bring on chest pain.  Do not use any tobacco products, including cigarettes, chewing tobacco, or electronic cigarettes. If you need help quitting, ask your health care provider.  Do not drink alcohol.  Take medicines only as directed by your health care provider.  Keep all follow-up visits as directed by your health care provider. This is important. This includes any further testing if your chest pain does not go away.  If heartburn is the cause for your chest pain, you may be told to keep your head raised (elevated) while sleeping. This reduces the chance that acid will go from your stomach into your esophagus.  Make lifestyle changes as directed by your health care provider. These may include:  Getting regular exercise. Ask your health care provider to suggest some activities that are safe for you.  Eating a heart-healthy diet. A registered dietitian can help you to learn healthy eating options.  Maintaining a healthy weight.  Managing diabetes, if necessary.  Reducing stress. SEEK MEDICAL CARE IF:  Your chest pain does not go away after treatment.  You have a rash with blisters on your chest.  You have a fever. SEEK IMMEDIATE MEDICAL CARE IF:   Your chest pain is worse.  You have an increasing cough, or you cough up blood.  You have severe abdominal pain.  You have severe weakness.  You faint.  You have chills.  You have sudden, unexplained chest discomfort.  You have sudden, unexplained discomfort in your arms, back, neck, or jaw.  You have shortness of breath at any time.  You suddenly start to sweat, or your skin gets clammy.  You feel nauseous or you vomit.  You suddenly feel light-headed or dizzy.  Your heart begins to beat quickly, or it feels like it is skipping beats. These symptoms may represent a serious problem that is an emergency. Do not wait to  see if the symptoms will go away. Get medical help right away. Call your local emergency services (911 in the [place] Do not drive yourself to the hospital.   This information is not intended to replace advice given to you by your health care provider. Make sure you discuss any questions you have with your health care provider.   Document Released: 07/12/2005 Document Revised: 10/23/2014 Document Reviewed: 05/08/2014 Elsevier Interactive Patient Education 2016 [employer]  Nausea, Adult Nausea is the feeling that you have an upset stomach or have to vomit. Nausea by itself is not likely a serious concern, but it may be an early sign of more serious medical problems. As nausea gets worse, it can lead to vomiting. If vomiting develops, there is the risk of dehydration.  CAUSES   Viral infections.  Food poisoning.  Medicines.  Pregnancy.  Motion sickness.  Migraine headaches.  Emotional distress.  Severe pain from any source.  Alcohol intoxication. HOME CARE INSTRUCTIONS  Get plenty of rest.  Ask your caregiver about specific rehydration instructions.  Eat small amounts of food and sip liquids more often.  Take all medicines as told by your caregiver. SEEK MEDICAL CARE IF:  You have not improved after 2 days, or you get worse.  You have a headache. SEEK IMMEDIATE MEDICAL CARE IF:   You have a fever.  You faint.  You keep vomiting or have blood in your vomit.  You are extremely weak or dehydrated.  You have dark or bloody stools.  You have severe chest or abdominal pain. MAKE SURE YOU:  Understand these instructions.  Will watch your condition.  Will get help right away if you are not doing well or get worse.   This information is not intended to replace advice given to you by your health care provider. Make sure you discuss any questions you have with your health care provider.   Document Released: 11/09/2004 Document Revised: 10/23/2014 Document  Reviewed: 06/14/2011 Elsevier Interactive Patient Education 2016 [employer].  Smoking Cessation, Tips for Success If you are ready to quit smoking, congratulations! You have chosen to help yourself be healthier. Cigarettes bring nicotine, tar, carbon monoxide, and other irritants into your body. Your lungs, heart, and blood vessels will be able to work better without these poisons. There are many different ways to quit smoking. Nicotine gum, nicotine patches, a nicotine inhaler, or nicotine nasal spray can help with physical craving. Hypnosis, support groups, and medicines help break the habit of smoking. WHAT THINGS CAN I DO TO MAKE QUITTING EASIER?  Here are some tips to help you quit for good:  Pick a date when you will quit smoking completely. Tell all of your friends and family about your plan to quit on that date.  Do not try to slowly cut down on the number of cigarettes you are smoking. Pick a quit date and quit smoking completely starting on that day.  Throw away all cigarettes.   Clean and remove all ashtrays from your home, work, and car.  On a card, write down your reasons for quitting. Carry the card with you and read it when you get the urge to smoke.  Cleanse your body of nicotine. Drink enough water and fluids to keep your urine clear or pale yellow. Do this after quitting to flush the nicotine from your body.  Learn to predict your moods. Do not let a bad situation be your excuse to have a cigarette. Some situations in your life might tempt you into wanting a cigarette.  Never have "just one" cigarette. It leads to wanting another and another. Remind yourself of your decision to quit.  Change habits associated with smoking. If you smoked while driving or when feeling stressed, try other activities to replace smoking. Stand up when drinking your coffee. Brush your teeth after eating. Sit in a different chair when you read the paper. Avoid alcohol while trying to quit, and  try to drink fewer caffeinated beverages. Alcohol and caffeine may urge you to smoke.  Avoid foods and drinks that can trigger a desire to smoke, such as sugary or spicy foods and alcohol.  Ask people who smoke not to smoke around you.  Have something planned to do right after eating or having a cup of coffee. For example, plan to take a walk or exercise.  Try a relaxation exercise to calm you down and decrease your stress. Remember, you may be tense and nervous for the first 2 weeks after you quit, but this will pass.  Find new activities to keep your hands busy. Play with a pen, coin, or rubber band. Doodle or draw things on paper.  Brush your teeth right after eating.  This will help cut down on the craving for the taste of tobacco after meals. You can also try mouthwash.   Use oral substitutes in place of cigarettes. Try using lemon drops, carrots, cinnamon sticks, or chewing gum. Keep them handy so they are available when you have the urge to smoke.  When you have the urge to smoke, try deep breathing.  Designate your home as a nonsmoking area.  If you are a heavy smoker, ask your health care provider about a prescription for nicotine chewing gum. It can ease your withdrawal from nicotine.  Reward yourself. Set aside the cigarette money you save and buy yourself something nice.  Look for support from others. Join a support group or smoking cessation program. Ask someone at home or at work to help you with your plan to quit smoking.  Always ask yourself, "Do I need this cigarette or is this just a reflex?" Tell yourself, "Today, I choose not to smoke," or "I do not want to smoke." You are reminding yourself of your decision to quit.  Do not replace cigarette smoking with electronic cigarettes (commonly called e-cigarettes). The safety of e-cigarettes is unknown, and some may contain harmful chemicals.  If you relapse, do not give up! Plan ahead and think about what you will do the  next time you get the urge to smoke. HOW WILL I FEEL WHEN I QUIT SMOKING? You may have symptoms of withdrawal because your body is used to nicotine (the addictive substance in cigarettes). You may crave cigarettes, be irritable, feel very hungry, cough often, get headaches, or have difficulty concentrating. The withdrawal symptoms are only temporary. They are strongest when you first quit but will go away within 10-14 days. When withdrawal symptoms occur, stay in control. Think about your reasons for quitting. Remind yourself that these are signs that your body is healing and getting used to being without cigarettes. Remember that withdrawal symptoms are easier to treat than the major diseases that smoking can cause.  Even after the withdrawal is over, expect periodic urges to smoke. However, these cravings are generally short lived and will go away whether you smoke or not. Do not smoke! WHAT RESOURCES ARE AVAILABLE TO HELP ME QUIT SMOKING? Your health care provider can direct you to community resources or hospitals for support, which may include:  Group support.  Education.  Hypnosis.  Therapy.   This information is not intended to replace advice given to you by your health care provider. Make sure you discuss any questions you have with your health care provider.   Document Released: 06/30/2004 Document Revised: 10/23/2014 Document Reviewed: 03/20/2013 Elsevier Interactive Patient Education [employer].

## 2016-01-13 NOTE — ED Notes (Signed)
Pt getting dressed into gown. Laid out urine cup and wipe to give urine samples.

## 2016-01-13 NOTE — ED Provider Notes (Signed)
CSN: [id number]     Arrival date & time 01/13/16  1238 History   First MD Initiated Contact with Patient 01/13/16 1537     Chief Complaint  Patient presents with  . Chest Pain     (Consider location/radiation/quality/duration/timing/severity/associated sxs/prior Treatment) HPI   This is a 75 year old female who presents emergency Department with chief complaint of nausea and chest pain. Patient states that she's had 4-5 days of malaise, fatigue, lightheadedness, nausea and intermittent left-sided chest pain which radiates into her left shoulder and left scapula. Chest he feels "pressure" in her chest and the left side of her neck. She states that today she went to work and around 8:30 AM started feeling aching, deep pain in the left chest with previously described radiating characteristics. She states it has been constant since that time. She took a daily baby aspirin, which she normally does well with the Prevacid and her antihypertensives. Risk factors for acute coronary syndrome include long history of smoking, hypertension. The family history of MI in her mother and father. However, they were both over the age of 34. She has no known arterial disease. Her daughter states that she is frequently anxious and that she does a lot of heavy lifting including moving her plants and furniture around her house.  Past Medical History  Diagnosis Date  . No significant past medical history   . History of nuclear stress test     2002- normal   . Anxiety   . Dysrhythmia     every once in a while feel that her heart skips a beat   . GERD (gastroesophageal reflux disease)     reports barium swallow- 12/2011- has evidence of stricture, spasm  . 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 Childbirth x5 - all vaginal     . Eye surgery      cataracts - bilateral removed- /w IOL  . Tonsillectomy      as a child   . Lumbar laminectomy  06/14/2012   Procedure: MICRODISCECTOMY LUMBAR LAMINECTOMY;  Surgeon: Marybelle Killings, MD;  Location: [facility];  Service: Orthopedics;  Laterality: Left;  Left L4-5, L5-S1 Microdiscectomy, Hemilaminectomy, Foraminotomy   Family History  Problem Relation Age of Onset  . Heart disease Father   . Hypertension Father   . Heart disease Mother   . Coronary artery disease Mother   . Hyperlipidemia Sister   . Hypertension Sister    Social History  Substance Use Topics  . Smoking status: Current Every Day Smoker -- 0.50 packs/day for 50 years    Types: Cigarettes  . Smokeless tobacco: Never Used  . Alcohol Use: 0.0 oz/week    0 Standard drinks or equivalent per week     Comment: 1 drink per day   OB History    No data available     Review of Systems  Ten systems reviewed and are negative for acute change, except as noted in the HPI.    Allergies  Sulfa antibiotics  Home Medications   Prior to Admission medications   Medication Sig Start Date End Date Taking? Authorizing Provider  aspirin EC 81 MG tablet Take 81 mg by mouth daily.   Yes Historical Provider, MD  lansoprazole (PREVACID) 30 MG capsule Take 30 mg by mouth daily with supper.    Yes Historical Provider, MD  valsartan (DIOVAN) 80 MG tablet Take 1 tablet by mouth daily. 08/20/15  Yes Historical Provider, MD   BP 191/93 mmHg  Pulse 66  Temp(Src) 98.1 F (36.7 C) (Oral)  Resp 14  Wt 56.7 kg  SpO2 98% Physical Exam  Constitutional: She is oriented to person, place, and time. She appears well-developed and well-nourished. No distress.  HENT:  Head: Normocephalic and atraumatic.  Eyes: Conjunctivae are normal. No scleral icterus.  Neck: Normal range of motion.  Cardiovascular: Normal rate, regular rhythm and normal heart sounds.  Exam reveals no gallop and no friction rub.   No murmur heard. Pulmonary/Chest: Effort normal and breath sounds normal. No respiratory distress. Rales: Tender to palpation left chest wall, left neck and shoulder  blade. Patient states it is not elicited. She exhibits tenderness.  Abdominal: Soft. Bowel sounds are normal. She exhibits no distension and no mass. There is no tenderness. There is no guarding.  Neurological: She is alert and oriented to person, place, and time.  Skin: Skin is warm and dry. She is not diaphoretic.  Nursing note and vitals reviewed.   ED Course  Procedures (including critical care time) Labs Review Labs Reviewed  BASIC METABOLIC PANEL - Abnormal; Notable for the following:    Creatinine, Ser 1.05 (*)    GFR calc non Af Amer 51 (*)    GFR calc Af Amer 59 (*)    All other components within normal limits  CBC  URINALYSIS, ROUTINE W REFLEX MICROSCOPIC (NOT AT [facility])  I-STAT TROPOININ, ED  I-STAT TROPOININ, ED    Imaging Review Dg Chest 2 View  01/13/2016  CLINICAL DATA:  Chest pain for 4 days EXAM: CHEST  2 VIEW COMPARISON:  February 08, 2015 chest radiograph and chest CT September 27, 2015 FINDINGS: There is no edema or consolidation. Heart size and pulmonary vascularity are normal. No adenopathy. There is degenerative change in the thoracic spine. IMPRESSION: No edema or consolidation. Electronically Signed   By: Lowella Grip III M.D.   On: 01/13/2016 13:06   I have personally reviewed and evaluated these images and lab results as part of my medical decision-making.   EKG Interpretation   Date/Time:  Thursday January 13 2016 12:42:20 EDT Ventricular Rate:  75 PR Interval:  140 QRS Duration: 80 QT Interval:  392 QTC Calculation: 437 R Axis:   8 Text Interpretation:  Normal sinus rhythm no significant change since 2013  Confirmed by GOLDSTON  MD, SCOTT ([id number]) on 01/13/2016 3:19:18 PM      MDM   Final diagnoses:  Atypical chest pain  Nausea  Essential hypertension  Tobacco abuse   6:15 PM BP 191/93 mmHg  Pulse 66  Temp(Src) 98.1 F (36.7 C) (Oral)  Resp 14  Wt 56.7 kg  SpO2 98% Shin with a heart score of 3, her initial EKGs aren't concerning.  Negative troponin. Will repeat troponin. Evaluate urine   6:15 PM  Patient with 2 negative troponins. She has muscular skeletal pain on examination. She is low risk for major adverse cardiac event with a heart score of 3. Pain has been going on for 5 days. I doubt acute coronary syndrome as the cause. I suspect musculoskeletal origin along with some anxiety. Patient is to be discharged with recommendation to follow up with PCP in regards to today's hospital visit. Chest pain is not likely of cardiac or pulmonary etiology d/t presentation, perc negative, VSS, no tracheal deviation, no JVD or new murmur, RRR, breath sounds equal bilaterally, EKG without acute abnormalities, negative troponin, and negative CXR. Pt has been advised continue  PPI and return to the ED is CP becomes exertional, associated with diaphoresis or nausea, radiates to left jaw/arm, worsens or becomes concerning in any way. Pt appears reliable for follow up and is agreeable to discharge.   Case has been discussed with and seen by Dr. Regenia Skeeter who agrees with the above plan to discharge.  Patient seen in shared visit with attending physician.    Margarita Mail, PA-C 01/13/16 1815  Sherwood Gambler, MD 01/18/16 [id number]

## 2016-01-13 NOTE — ED Notes (Signed)
Pt presents with general malaise x 4-5 days with intermittent chest pressure x 2 days.  Pt reports it feels like heartburn, reports nausea.  Pt reports pain radiates from her chest, into her neck and into L scapula.

## 2016-01-20 DIAGNOSIS — R0789 Other chest pain: Secondary | ICD-10-CM | POA: Diagnosis not present

## 2016-01-20 DIAGNOSIS — K219 Gastro-esophageal reflux disease without esophagitis: Secondary | ICD-10-CM | POA: Diagnosis not present

## 2016-01-20 DIAGNOSIS — Z720 Tobacco use: Secondary | ICD-10-CM | POA: Diagnosis not present

## 2016-01-20 DIAGNOSIS — F419 Anxiety disorder, unspecified: Secondary | ICD-10-CM | POA: Diagnosis not present

## 2016-01-20 DIAGNOSIS — I10 Essential (primary) hypertension: Secondary | ICD-10-CM | POA: Diagnosis not present

## 2016-02-24 DIAGNOSIS — F411 Generalized anxiety disorder: Secondary | ICD-10-CM | POA: Diagnosis not present

## 2016-04-19 ENCOUNTER — Encounter: Payer: Self-pay | Admitting: *Deleted

## 2016-04-27 DIAGNOSIS — K219 Gastro-esophageal reflux disease without esophagitis: Secondary | ICD-10-CM | POA: Diagnosis not present

## 2016-04-27 DIAGNOSIS — R21 Rash and other nonspecific skin eruption: Secondary | ICD-10-CM | POA: Diagnosis not present

## 2016-04-27 DIAGNOSIS — R8290 Unspecified abnormal findings in urine: Secondary | ICD-10-CM | POA: Diagnosis not present

## 2016-04-27 DIAGNOSIS — I10 Essential (primary) hypertension: Secondary | ICD-10-CM | POA: Diagnosis not present

## 2016-04-27 DIAGNOSIS — Z7982 Long term (current) use of aspirin: Secondary | ICD-10-CM | POA: Diagnosis not present

## 2016-04-27 DIAGNOSIS — Z79899 Other long term (current) drug therapy: Secondary | ICD-10-CM | POA: Diagnosis not present

## 2016-04-27 DIAGNOSIS — Z1211 Encounter for screening for malignant neoplasm of colon: Secondary | ICD-10-CM | POA: Diagnosis not present

## 2016-07-26 ENCOUNTER — Ambulatory Visit
Admission: RE | Admit: 2016-07-26 | Discharge: 2016-07-26 | Disposition: A | Discharge status: Home / Self Care | Payer: Commercial Managed Care - HMO | Source: Ambulatory Visit | Attending: Family Medicine | Admitting: Family Medicine

## 2016-07-26 ENCOUNTER — Other Ambulatory Visit: Payer: Self-pay | Admitting: Family Medicine

## 2016-07-26 DIAGNOSIS — J209 Acute bronchitis, unspecified: Secondary | ICD-10-CM | POA: Diagnosis not present

## 2016-07-26 DIAGNOSIS — R05 Cough: Secondary | ICD-10-CM | POA: Diagnosis not present

## 2016-07-26 DIAGNOSIS — J019 Acute sinusitis, unspecified: Secondary | ICD-10-CM | POA: Diagnosis not present

## 2016-07-26 DIAGNOSIS — R0602 Shortness of breath: Secondary | ICD-10-CM | POA: Diagnosis not present

## 2016-07-26 DIAGNOSIS — R059 Cough, unspecified: Secondary | ICD-10-CM

## 2016-10-27 DIAGNOSIS — K219 Gastro-esophageal reflux disease without esophagitis: Secondary | ICD-10-CM | POA: Diagnosis not present

## 2016-10-27 DIAGNOSIS — I10 Essential (primary) hypertension: Secondary | ICD-10-CM | POA: Diagnosis not present

## 2016-11-22 DIAGNOSIS — A084 Viral intestinal infection, unspecified: Secondary | ICD-10-CM | POA: Diagnosis not present

## 2016-11-26 ENCOUNTER — Encounter (HOSPITAL_COMMUNITY): Payer: Self-pay | Admitting: *Deleted

## 2016-11-26 ENCOUNTER — Inpatient Hospital Stay (HOSPITAL_COMMUNITY)
Admission: EM | Admit: 2016-11-26 | Discharge: 2016-12-01 | DRG: 392 | Disposition: A | Discharge status: Home / Self Care | Payer: Medicare HMO | Attending: Internal Medicine | Admitting: Internal Medicine

## 2016-11-26 DIAGNOSIS — Z9841 Cataract extraction status, right eye: Secondary | ICD-10-CM | POA: Diagnosis not present

## 2016-11-26 DIAGNOSIS — E860 Dehydration: Secondary | ICD-10-CM | POA: Diagnosis not present

## 2016-11-26 DIAGNOSIS — K5730 Diverticulosis of large intestine without perforation or abscess without bleeding: Secondary | ICD-10-CM | POA: Diagnosis present

## 2016-11-26 DIAGNOSIS — Z79899 Other long term (current) drug therapy: Secondary | ICD-10-CM | POA: Diagnosis not present

## 2016-11-26 DIAGNOSIS — A084 Viral intestinal infection, unspecified: Secondary | ICD-10-CM | POA: Diagnosis not present

## 2016-11-26 DIAGNOSIS — Z8249 Family history of ischemic heart disease and other diseases of the circulatory system: Secondary | ICD-10-CM | POA: Diagnosis not present

## 2016-11-26 DIAGNOSIS — G8929 Other chronic pain: Secondary | ICD-10-CM | POA: Diagnosis present

## 2016-11-26 DIAGNOSIS — Z720 Tobacco use: Secondary | ICD-10-CM | POA: Diagnosis not present

## 2016-11-26 DIAGNOSIS — Z9842 Cataract extraction status, left eye: Secondary | ICD-10-CM | POA: Diagnosis not present

## 2016-11-26 DIAGNOSIS — R112 Nausea with vomiting, unspecified: Secondary | ICD-10-CM | POA: Diagnosis present

## 2016-11-26 DIAGNOSIS — I129 Hypertensive chronic kidney disease with stage 1 through stage 4 chronic kidney disease, or unspecified chronic kidney disease: Secondary | ICD-10-CM | POA: Diagnosis present

## 2016-11-26 DIAGNOSIS — K567 Ileus, unspecified: Secondary | ICD-10-CM | POA: Diagnosis present

## 2016-11-26 DIAGNOSIS — M1990 Unspecified osteoarthritis, unspecified site: Secondary | ICD-10-CM | POA: Diagnosis present

## 2016-11-26 DIAGNOSIS — Z882 Allergy status to sulfonamides status: Secondary | ICD-10-CM | POA: Diagnosis not present

## 2016-11-26 DIAGNOSIS — K6389 Other specified diseases of intestine: Secondary | ICD-10-CM | POA: Diagnosis not present

## 2016-11-26 DIAGNOSIS — R109 Unspecified abdominal pain: Secondary | ICD-10-CM

## 2016-11-26 DIAGNOSIS — Z961 Presence of intraocular lens: Secondary | ICD-10-CM | POA: Diagnosis present

## 2016-11-26 DIAGNOSIS — N179 Acute kidney failure, unspecified: Secondary | ICD-10-CM | POA: Diagnosis not present

## 2016-11-26 DIAGNOSIS — E876 Hypokalemia: Secondary | ICD-10-CM | POA: Diagnosis present

## 2016-11-26 DIAGNOSIS — Z9889 Other specified postprocedural states: Secondary | ICD-10-CM

## 2016-11-26 DIAGNOSIS — M549 Dorsalgia, unspecified: Secondary | ICD-10-CM | POA: Diagnosis present

## 2016-11-26 DIAGNOSIS — K529 Noninfective gastroenteritis and colitis, unspecified: Secondary | ICD-10-CM | POA: Diagnosis present

## 2016-11-26 DIAGNOSIS — N183 Chronic kidney disease, stage 3 unspecified: Secondary | ICD-10-CM | POA: Diagnosis present

## 2016-11-26 DIAGNOSIS — R1084 Generalized abdominal pain: Secondary | ICD-10-CM | POA: Diagnosis not present

## 2016-11-26 DIAGNOSIS — Z7982 Long term (current) use of aspirin: Secondary | ICD-10-CM

## 2016-11-26 DIAGNOSIS — I10 Essential (primary) hypertension: Secondary | ICD-10-CM | POA: Diagnosis not present

## 2016-11-26 DIAGNOSIS — K219 Gastro-esophageal reflux disease without esophagitis: Secondary | ICD-10-CM | POA: Diagnosis not present

## 2016-11-26 DIAGNOSIS — R1030 Lower abdominal pain, unspecified: Secondary | ICD-10-CM | POA: Diagnosis not present

## 2016-11-26 DIAGNOSIS — F17210 Nicotine dependence, cigarettes, uncomplicated: Secondary | ICD-10-CM | POA: Diagnosis present

## 2016-11-26 DIAGNOSIS — R197 Diarrhea, unspecified: Secondary | ICD-10-CM

## 2016-11-26 DIAGNOSIS — R1110 Vomiting, unspecified: Secondary | ICD-10-CM | POA: Diagnosis not present

## 2016-11-26 HISTORY — DX: Dorsalgia, unspecified: M54.9

## 2016-11-26 HISTORY — DX: Tobacco use: Z72.0

## 2016-11-26 HISTORY — DX: Essential (primary) hypertension: I10

## 2016-11-26 LAB — COMPREHENSIVE METABOLIC PANEL
ALBUMIN: 4 g/dL (ref 3.5–5.0)
ALT: 14 U/L (ref 14–54)
ANION GAP: 11 (ref 5–15)
AST: 21 U/L (ref 15–41)
Alkaline Phosphatase: 74 U/L (ref 38–126)
BILIRUBIN TOTAL: 0.6 mg/dL (ref 0.3–1.2)
BUN: 22 mg/dL — ABNORMAL HIGH (ref 6–20)
CO2: 24 mmol/L (ref 22–32)
Calcium: 9.9 mg/dL (ref 8.9–10.3)
Chloride: 101 mmol/L (ref 101–111)
Creatinine, Ser: 1.24 mg/dL — ABNORMAL HIGH (ref 0.44–1.00)
GFR calc Af Amer: 48 mL/min — ABNORMAL LOW (ref >60)
GFR, EST NON AFRICAN AMERICAN: 41 mL/min — AB (ref >60)
GLUCOSE: 136 mg/dL — AB (ref 65–99)
POTASSIUM: 4 mmol/L (ref 3.5–5.1)
Sodium: 136 mmol/L (ref 135–145)
TOTAL PROTEIN: 7 g/dL (ref 6.5–8.1)

## 2016-11-26 LAB — CBC
HEMATOCRIT: 42 % (ref 36.0–46.0)
Hemoglobin: 14 g/dL (ref 12.0–15.0)
MCH: 27.7 pg (ref 26.0–34.0)
MCHC: 33.3 g/dL (ref 30.0–36.0)
MCV: 83.2 fL (ref 78.0–100.0)
Platelets: 304 10*3/uL (ref 150–400)
RBC: 5.05 MIL/uL (ref 3.87–5.11)
RDW: 13.4 % (ref 11.5–15.5)
WBC: 14.5 10*3/uL — AB (ref 4.0–10.5)

## 2016-11-26 LAB — URINALYSIS, ROUTINE W REFLEX MICROSCOPIC
BACTERIA UA: NONE SEEN
BILIRUBIN URINE: NEGATIVE
Glucose, UA: NEGATIVE mg/dL
Ketones, ur: 5 mg/dL — AB
LEUKOCYTES UA: NEGATIVE
NITRITE: NEGATIVE
PROTEIN: NEGATIVE mg/dL
Specific Gravity, Urine: 1.015 (ref 1.005–1.030)
pH: 5 (ref 5.0–8.0)

## 2016-11-26 LAB — LIPASE, BLOOD: Lipase: 23 U/L (ref 11–51)

## 2016-11-26 MED ORDER — MORPHINE SULFATE (PF) 4 MG/ML IV SOLN
4.0000 mg | Freq: Once | INTRAVENOUS | Status: AC
  Administered 2016-11-26: 4 mg via INTRAVENOUS
  Filled 2016-11-26: qty 1

## 2016-11-26 MED ORDER — SODIUM CHLORIDE 0.9 % IV BOLUS (SEPSIS)
1000.0000 mL | Freq: Once | INTRAVENOUS | Status: AC
  Administered 2016-11-26: 1000 mL via INTRAVENOUS

## 2016-11-26 MED ORDER — ONDANSETRON 4 MG PO TBDP
ORAL_TABLET | ORAL | Status: AC
  Filled 2016-11-26: qty 1

## 2016-11-26 MED ORDER — ONDANSETRON HCL 4 MG/2ML IJ SOLN
4.0000 mg | Freq: Once | INTRAMUSCULAR | Status: AC
  Administered 2016-11-26: 4 mg via INTRAVENOUS
  Filled 2016-11-26: qty 2

## 2016-11-26 MED ORDER — HYDROMORPHONE HCL 2 MG/ML IJ SOLN
1.0000 mg | Freq: Once | INTRAMUSCULAR | Status: AC
  Administered 2016-11-27: 1 mg via INTRAVENOUS
  Filled 2016-11-26: qty 1

## 2016-11-26 MED ORDER — ONDANSETRON 4 MG PO TBDP
4.0000 mg | ORAL_TABLET | Freq: Once | ORAL | Status: AC | PRN
  Administered 2016-11-26: 4 mg via ORAL

## 2016-11-26 NOTE — ED Notes (Signed)
Attempted IV x2. Blanch Media RN attempting @ this time.

## 2016-11-26 NOTE — ED Notes (Signed)
Pt vomited again in triage after zofran and appears in considerable pain.  Hold placed on room for pt in back

## 2016-11-26 NOTE — ED Triage Notes (Signed)
Pt had n/v/d last Saturday and saw her PCP who gave her nausea and anti diarrheal meds.  Pt continues to not feel great and today she began having severe lower abdominal pain with increased nausea and vomiting and no further diarrhea (last BM was last tuesday when she still had watery diarrhea).  Pt denies any gas for the past few days.  Pt appears in obvious discomfort.

## 2016-11-26 NOTE — ED Provider Notes (Signed)
[facility] DEPT Provider Note   CSN: [id number] Arrival date & time: 11/26/16  2130  By signing my name below, I, Ethelle Lyon Long, attest that this documentation has been prepared under the direction and in the presence of Merryl Hacker, MD . Electronically Signed: Ethelle Lyon Long, Scribe. 11/26/2016. 12:15 AM.    History   Chief Complaint Chief Complaint  Patient presents with  . Abdominal Pain    The history is provided by the patient. No language interpreter was used.    HPI Comments:  Michele Lewis is a 76 y.o. female with a PMHx of GERD and Diverticulitis, who presents to the Emergency Department complaining of sharp, generalized, abdominal pain onset today at 4:00PM. She currently rates the pain as 5/10. She has associated symptoms of nausea, vomiting, diarrhea and subjective fever. Notes she saw her PCP four days ago and got an anti-emetic Rx, which improved her symptoms with minimal relief prior to this afternoon full of dry-heaving. She notes her last BM was five days ago qualified as watery diarrhea. No h/o abdominal surgeries. Pt denies hematemesis, urgency, frequency, hematuria, dysuria, difficulty urinating, and any other associated symptoms at this time.   Past Medical History:  Diagnosis Date  . Anxiety   . 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 Back pain   . Dysrhythmia    every once in a while feel that her heart skips a beat   . GERD (gastroesophageal reflux disease)    reports barium swallow- 12/2011- has evidence of stricture, spasm  . History of nuclear stress test    2002- normal   . Hypertension   . No significant past medical history   . Tobacco abuse     Patient Active Problem List   Diagnosis Date Noted  . Abdominal pain 11/27/2016  . Nausea vomiting and diarrhea 11/27/2016  . Acute renal failure superimposed on stage 3 chronic kidney disease ([place]) 11/27/2016  . GERD (gastroesophageal reflux disease)   . Hypertension   .  Tobacco abuse   . Back pain   . Solitary pulmonary nodule 09/16/2015  . Abnormal CT scan, liver 09/16/2015  . HNP (herniated nucleus pulposus), lumbar 06/14/2012    Class: Diagnosis of  . No significant past medical history     Past Surgical History:  Procedure Laterality Date  . childbirth x5 - all vaginal     . EYE SURGERY     cataracts - bilateral removed- /w IOL  . LUMBAR LAMINECTOMY  06/14/2012   Procedure: MICRODISCECTOMY LUMBAR LAMINECTOMY;  Surgeon: Marybelle Killings, MD;  Location: [facility];  Service: Orthopedics;  Laterality: Left;  Left L4-5, L5-S1 Microdiscectomy, Hemilaminectomy, Foraminotomy  . TONSILLECTOMY     as a child     OB History    No data available       Home Medications    Prior to Admission medications   Medication Sig Start Date End Date Taking? Authorizing Provider  aspirin EC 81 MG tablet Take 81 mg by mouth daily.   Yes Historical Provider, MD  lansoprazole (PREVACID) 30 MG capsule Take 30 mg by mouth daily with supper.    Yes Historical Provider, MD  naproxen sodium (ALEVE) 220 MG tablet Take 110 mg by mouth daily as needed (pain/headache).   Yes Historical Provider, MD  ondansetron (ZOFRAN) 8 MG tablet Take 8 mg by mouth every 8 (eight) hours as needed. 11/22/16  Yes Historical Provider, MD  valsartan (DIOVAN) 160 MG tablet  Take 160 mg by mouth daily. 10/15/16  Yes Historical Provider, MD    Family History Family History  Problem Relation Age of Onset  . Heart disease Father   . Hypertension Father   . Heart disease Mother   . Coronary artery disease Mother   . Hyperlipidemia Sister   . Hypertension Sister     Social History Social History  Substance Use Topics  . Smoking status: Current Every Day Smoker    Packs/day: 0.50    Years: 50.00    Types: Cigarettes  . Smokeless tobacco: Never Used  . Alcohol use 0.0 oz/week     Comment: 1 drink per day     Allergies   Sulfa antibiotics   Review of Systems Review of Systems    Constitutional: Positive for chills. Negative for fever (subjective).  Gastrointestinal: Positive for abdominal pain, diarrhea, nausea and vomiting.       - hematemesis   Genitourinary: Negative for difficulty urinating, dysuria, frequency, hematuria and urgency.  All other systems reviewed and are negative.    Physical Exam Updated Vital Signs BP 185/93   Pulse 96   Temp 97.6 F (36.4 C) (Oral)   Resp 16   SpO2 92%   Physical Exam  Constitutional: She is oriented to person, place, and time. No distress.  Uncomfortable appearing  HENT:  Head: Normocephalic and atraumatic.  Cardiovascular: Normal rate, regular rhythm and normal heart sounds.   Pulmonary/Chest: Effort normal and breath sounds normal. No respiratory distress. She has no wheezes.  Abdominal: Soft. Bowel sounds are normal. She exhibits distension.  Diffuse tenderness to palpation, voluntary guarding, no point tenderness, hypoactive bowel sounds  Genitourinary:  Genitourinary Comments: No significant stool in the rectal vault  Neurological: She is alert and oriented to person, place, and time.  Skin: Skin is warm and dry.  Psychiatric: She has a normal mood and affect.  Nursing note and vitals reviewed.    ED Treatments / Results  DIAGNOSTIC STUDIES:  Oxygen Saturation is 98% on RA, normal by my interpretation.    COORDINATION OF CARE:  11:35 PM Discussed treatment plan with pt at bedside including pain medication and CT A/P and pt agreed to plan.  Labs (all labs ordered are listed, but only abnormal results are displayed) Labs Reviewed  COMPREHENSIVE METABOLIC PANEL - Abnormal; Notable for the following:       Result Value   Glucose, Bld 136 (*)    BUN 22 (*)    Creatinine, Ser 1.24 (*)    GFR calc non Af Amer 41 (*)    GFR calc Af Amer 48 (*)    All other components within normal limits  CBC - Abnormal; Notable for the following:    WBC 14.5 (*)    All other components within normal limits   URINALYSIS, ROUTINE W REFLEX MICROSCOPIC - Abnormal; Notable for the following:    Hgb urine dipstick SMALL (*)    Ketones, ur 5 (*)    Squamous Epithelial / LPF 0-5 (*)    All other components within normal limits  LIPASE, BLOOD    EKG  EKG Interpretation None       Radiology Ct Abdomen Pelvis W Contrast  Result Date: 11/27/2016 CLINICAL DATA:  Abdominal pain and vomiting. EXAM: CT ABDOMEN AND PELVIS WITH CONTRAST TECHNIQUE: Multidetector CT imaging of the abdomen and pelvis was performed using the standard protocol following bolus administration of intravenous contrast. CONTRAST:  73mL ISOVUE-300 IOPAMIDOL (ISOVUE-300) INJECTION 61% COMPARISON:  None. FINDINGS: Lower chest: Bibasilar atelectasis. Small hiatal hernia. Normal size cardiac chambers without pericardial effusion. Hepatobiliary: Slight increase in periportal edema. No space-occupying mass of the liver. Gallbladder is physiologically distended without calculus. Pancreas: Negative Spleen: Negative Adrenals/Urinary Tract: Normal bilateral adrenal glands. No obstructive uropathy or enhancing renal mass. Unremarkable bladder. Stomach/Bowel: Stomach is moderately distended with fluid. No small bowel dilatation. Normal small bowel rotation. There is fluid-filled distention of large bowel with cecum measuring up to 6.4 cm in caliber extending to the level of the sigmoid colon with there is moderate to large colonic stool burden and diverticulosis of the sigmoid. No focal inflammation. Mild distal tapering the rectosigmoid. Vascular/Lymphatic: Prominent left periuterine vessels. Aortoiliac atherosclerosis without aneurysm. Reproductive: Uterus and adnexa demonstrate no acute appearing abnormalities. Other: No free air nor free fluid. Musculoskeletal: Mild fat containing left inguinal hernia. Degenerative disc disease most marked at L4-5 and L5-S1. Left L5 laminectomy. No acute osseous abnormality. IMPRESSION: Fluid-filled distention of large  bowel from cecum to sigmoid colon where there is a moderate to large stool burden noted within the sigmoid contributing to this distention. No focal mass nor annular constricting lesion is apparent. There is sigmoid diverticulosis without acute inflammation. Slight increase in periportal edema, nonspecific since prior exam. Findings can be seen in hypervolemia, passive hepatic congestion, hepatitis or cholangitis among bold some possibilities. Electronically Signed   By: Ashley Royalty M.D.   On: 11/27/2016 01:11   Dg Abdomen Acute W/chest  Result Date: 11/27/2016 CLINICAL DATA:  Mid abdominal pain since Sunday. Nausea and vomiting. EXAM: DG ABDOMEN ACUTE W/ 1V CHEST COMPARISON:  Chest 07/26/2016.  CT abdomen and pelvis 09/07/2015 FINDINGS: Normal heart size and pulmonary vascularity. Emphysematous changes in the lungs with central interstitial pattern suggesting chronic bronchitis. No focal airspace disease or consolidation. No blunting of costophrenic angles. No pneumothorax. Calcification of the aorta. Gas-filled mildly dilated central mid abdominal small bowel loops. Multiple air-fluid levels demonstrated centrally. Appearance suggests early small bowel obstruction. No free intra- abdominal air. No radiopaque stones. Degenerative changes in the spine and hips. IMPRESSION: Emphysematous changes and chronic bronchitic changes in the lungs. Gas-filled dilated central mid abdominal small bowel with air-fluid levels suggesting early small bowel obstruction. Electronically Signed   By: Lucienne Capers M.D.   On: 11/27/2016 00:54    Procedures Procedures (including critical care time)  Medications Ordered in ED Medications  sodium phosphate (FLEET) 7-19 GM/118ML enema 1 enema (not administered)  promethazine (PHENERGAN) injection 12.5 mg (not administered)  ondansetron (ZOFRAN-ODT) disintegrating tablet 4 mg (4 mg Oral Given 11/26/16 2159)  morphine 4 MG/ML injection 4 mg (4 mg Intravenous Given 11/26/16  2312)  ondansetron (ZOFRAN) injection 4 mg (4 mg Intravenous Given 11/26/16 2312)  sodium chloride 0.9 % bolus 1,000 mL (0 mLs Intravenous Stopped 11/27/16 0120)  HYDROmorphone (DILAUDID) injection 1 mg (1 mg Intravenous Given 11/27/16 0028)  iopamidol (ISOVUE-300) 61 % injection (100 mLs  Contrast Given 11/27/16 0043)     Initial Impression / Assessment and Plan / ED Course  I have reviewed the triage vital signs and the nursing notes.  Pertinent labs & imaging results that were available during my care of the patient were reviewed by me and considered in my medical decision making (see chart for details).     Patient presents with abdominal pain. Reports recent viral GI illness. Last normal bowel movement was on Tuesday. She has some voluntary guarding on exam. Vital signs are reassuring. She does appear very uncomfortable. Patient was  given pain and nausea medication. Lab work notable for leukocytosis and mild AK I.  Initial x-rays concerning for partial SBO. CT scan obtained. This does show dilated large bowel but also shows a large stool burden. No stool to disimpact on exam. Patient continues to have significant pain. Will admit for bowel cleanout. Discussed with Dr. Blaine Hamper.  Final Clinical Impressions(s) / ED Diagnoses   Final diagnoses:  Generalized abdominal pain  Gastroesophageal reflux disease without esophagitis  Essential hypertension  Tobacco abuse    New Prescriptions New Prescriptions   No medications on file   I personally performed the services described in this documentation, which was scribed in my presence. The recorded information has been reviewed and is accurate.     Merryl Hacker, MD 11/27/16 [phone]

## 2016-11-27 ENCOUNTER — Encounter (HOSPITAL_COMMUNITY): Payer: Self-pay | Admitting: Internal Medicine

## 2016-11-27 ENCOUNTER — Emergency Department (HOSPITAL_COMMUNITY): Payer: Medicare HMO

## 2016-11-27 DIAGNOSIS — R112 Nausea with vomiting, unspecified: Secondary | ICD-10-CM | POA: Diagnosis present

## 2016-11-27 DIAGNOSIS — M549 Dorsalgia, unspecified: Secondary | ICD-10-CM | POA: Diagnosis present

## 2016-11-27 DIAGNOSIS — K567 Ileus, unspecified: Secondary | ICD-10-CM | POA: Diagnosis present

## 2016-11-27 DIAGNOSIS — I129 Hypertensive chronic kidney disease with stage 1 through stage 4 chronic kidney disease, or unspecified chronic kidney disease: Secondary | ICD-10-CM | POA: Diagnosis present

## 2016-11-27 DIAGNOSIS — K219 Gastro-esophageal reflux disease without esophagitis: Secondary | ICD-10-CM | POA: Diagnosis present

## 2016-11-27 DIAGNOSIS — Z9842 Cataract extraction status, left eye: Secondary | ICD-10-CM | POA: Diagnosis not present

## 2016-11-27 DIAGNOSIS — N183 Chronic kidney disease, stage 3 unspecified: Secondary | ICD-10-CM | POA: Diagnosis present

## 2016-11-27 DIAGNOSIS — M1990 Unspecified osteoarthritis, unspecified site: Secondary | ICD-10-CM | POA: Diagnosis present

## 2016-11-27 DIAGNOSIS — E876 Hypokalemia: Secondary | ICD-10-CM | POA: Diagnosis present

## 2016-11-27 DIAGNOSIS — Z79899 Other long term (current) drug therapy: Secondary | ICD-10-CM | POA: Diagnosis not present

## 2016-11-27 DIAGNOSIS — Z882 Allergy status to sulfonamides status: Secondary | ICD-10-CM | POA: Diagnosis not present

## 2016-11-27 DIAGNOSIS — K529 Noninfective gastroenteritis and colitis, unspecified: Secondary | ICD-10-CM | POA: Diagnosis not present

## 2016-11-27 DIAGNOSIS — G8929 Other chronic pain: Secondary | ICD-10-CM | POA: Diagnosis present

## 2016-11-27 DIAGNOSIS — Z8249 Family history of ischemic heart disease and other diseases of the circulatory system: Secondary | ICD-10-CM | POA: Diagnosis not present

## 2016-11-27 DIAGNOSIS — Z961 Presence of intraocular lens: Secondary | ICD-10-CM | POA: Diagnosis present

## 2016-11-27 DIAGNOSIS — Z7982 Long term (current) use of aspirin: Secondary | ICD-10-CM | POA: Diagnosis not present

## 2016-11-27 DIAGNOSIS — R197 Diarrhea, unspecified: Secondary | ICD-10-CM

## 2016-11-27 DIAGNOSIS — E860 Dehydration: Secondary | ICD-10-CM | POA: Diagnosis present

## 2016-11-27 DIAGNOSIS — Z9889 Other specified postprocedural states: Secondary | ICD-10-CM | POA: Diagnosis not present

## 2016-11-27 DIAGNOSIS — F17210 Nicotine dependence, cigarettes, uncomplicated: Secondary | ICD-10-CM | POA: Diagnosis present

## 2016-11-27 DIAGNOSIS — R1030 Lower abdominal pain, unspecified: Secondary | ICD-10-CM

## 2016-11-27 DIAGNOSIS — I10 Essential (primary) hypertension: Secondary | ICD-10-CM

## 2016-11-27 DIAGNOSIS — Z9841 Cataract extraction status, right eye: Secondary | ICD-10-CM | POA: Diagnosis not present

## 2016-11-27 DIAGNOSIS — K5730 Diverticulosis of large intestine without perforation or abscess without bleeding: Secondary | ICD-10-CM | POA: Diagnosis present

## 2016-11-27 DIAGNOSIS — Z720 Tobacco use: Secondary | ICD-10-CM | POA: Diagnosis present

## 2016-11-27 DIAGNOSIS — N179 Acute kidney failure, unspecified: Secondary | ICD-10-CM | POA: Diagnosis present

## 2016-11-27 DIAGNOSIS — R109 Unspecified abdominal pain: Secondary | ICD-10-CM | POA: Diagnosis present

## 2016-11-27 DIAGNOSIS — A084 Viral intestinal infection, unspecified: Secondary | ICD-10-CM | POA: Diagnosis present

## 2016-11-27 LAB — CBC
HCT: 45.9 % (ref 36.0–46.0)
HEMOGLOBIN: 15.2 g/dL — AB (ref 12.0–15.0)
MCH: 27.6 pg (ref 26.0–34.0)
MCHC: 33.1 g/dL (ref 30.0–36.0)
MCV: 83.5 fL (ref 78.0–100.0)
Platelets: 281 10*3/uL (ref 150–400)
RBC: 5.5 MIL/uL — AB (ref 3.87–5.11)
RDW: 13.4 % (ref 11.5–15.5)
WBC: 20.9 10*3/uL — ABNORMAL HIGH (ref 4.0–10.5)

## 2016-11-27 LAB — CBC WITH DIFFERENTIAL/PLATELET
BASOS ABS: 0 10*3/uL (ref 0.0–0.1)
Basophils Relative: 0 %
Eosinophils Absolute: 0 10*3/uL (ref 0.0–0.7)
Eosinophils Relative: 0 %
HCT: 46.6 % — ABNORMAL HIGH (ref 36.0–46.0)
Hemoglobin: 15 g/dL (ref 12.0–15.0)
LYMPHS PCT: 6 %
Lymphs Abs: 0.9 10*3/uL (ref 0.7–4.0)
MCH: 26.9 pg (ref 26.0–34.0)
MCHC: 32.2 g/dL (ref 30.0–36.0)
MCV: 83.5 fL (ref 78.0–100.0)
Monocytes Absolute: 0.4 10*3/uL (ref 0.1–1.0)
Monocytes Relative: 3 %
NEUTROS PCT: 91 %
Neutro Abs: 14.7 10*3/uL — ABNORMAL HIGH (ref 1.7–7.7)
Platelets: 265 10*3/uL (ref 150–400)
RBC: 5.58 MIL/uL — AB (ref 3.87–5.11)
RDW: 13.9 % (ref 11.5–15.5)
WBC: 16 10*3/uL — AB (ref 4.0–10.5)

## 2016-11-27 LAB — BASIC METABOLIC PANEL
ANION GAP: 14 (ref 5–15)
BUN: 23 mg/dL — ABNORMAL HIGH (ref 6–20)
CALCIUM: 9.4 mg/dL (ref 8.9–10.3)
CO2: 19 mmol/L — AB (ref 22–32)
Chloride: 103 mmol/L (ref 101–111)
Creatinine, Ser: 1.18 mg/dL — ABNORMAL HIGH (ref 0.44–1.00)
GFR calc non Af Amer: 44 mL/min — ABNORMAL LOW (ref >60)
GFR, EST AFRICAN AMERICAN: 51 mL/min — AB (ref >60)
Glucose, Bld: 167 mg/dL — ABNORMAL HIGH (ref 65–99)
Potassium: 4.1 mmol/L (ref 3.5–5.1)
Sodium: 136 mmol/L (ref 135–145)

## 2016-11-27 LAB — LACTIC ACID, PLASMA: LACTIC ACID, VENOUS: 1.7 mmol/L (ref 0.5–1.9)

## 2016-11-27 MED ORDER — DOCUSATE SODIUM 100 MG PO CAPS: 100.0000 mg | ORAL_CAPSULE | Freq: Two times a day (BID) | ORAL | Status: DC | PRN

## 2016-11-27 MED ORDER — FAMOTIDINE IN NACL 20-0.9 MG/50ML-% IV SOLN
20.0000 mg | Freq: Two times a day (BID) | INTRAVENOUS | Status: DC
  Administered 2016-11-27 – 2016-11-28 (×4): 20 mg via INTRAVENOUS
  Filled 2016-11-27 (×4): qty 50

## 2016-11-27 MED ORDER — MORPHINE SULFATE (PF) 2 MG/ML IV SOLN
2.0000 mg | INTRAVENOUS | Status: DC | PRN
  Administered 2016-11-27 – 2016-11-29 (×5): 2 mg via INTRAVENOUS
  Filled 2016-11-27 (×5): qty 1

## 2016-11-27 MED ORDER — HYDRALAZINE HCL 20 MG/ML IJ SOLN
5.0000 mg | INTRAMUSCULAR | Status: DC | PRN
  Administered 2016-11-30: 5 mg via INTRAVENOUS
  Filled 2016-11-27: qty 1

## 2016-11-27 MED ORDER — ZOLPIDEM TARTRATE 5 MG PO TABS: 5.0000 mg | ORAL_TABLET | Freq: Every evening | ORAL | Status: DC | PRN

## 2016-11-27 MED ORDER — SENNA 8.6 MG PO TABS
1.0000 | ORAL_TABLET | Freq: Every day | ORAL | Status: DC
  Administered 2016-11-27: 8.6 mg via ORAL
  Filled 2016-11-27 (×2): qty 1

## 2016-11-27 MED ORDER — ENOXAPARIN SODIUM 30 MG/0.3ML ~~LOC~~ SOLN
30.0000 mg | SUBCUTANEOUS | Status: DC
  Administered 2016-11-27 – 2016-11-28 (×2): 30 mg via SUBCUTANEOUS
  Filled 2016-11-27 (×2): qty 0.3

## 2016-11-27 MED ORDER — SODIUM CHLORIDE 0.9 % IV BOLUS (SEPSIS)
1000.0000 mL | Freq: Once | INTRAVENOUS | Status: AC
Start: 2016-11-27 — End: 2016-11-27
  Administered 2016-11-27: 1000 mL via INTRAVENOUS

## 2016-11-27 MED ORDER — SORBITOL 70 % SOLN
960.0000 mL | TOPICAL_OIL | Freq: Once | ORAL | Status: DC
  Filled 2016-11-27: qty 240

## 2016-11-27 MED ORDER — SODIUM CHLORIDE 0.9 % IV SOLN
INTRAVENOUS | Status: DC
  Administered 2016-11-27 – 2016-12-01 (×4): via INTRAVENOUS

## 2016-11-27 MED ORDER — MORPHINE SULFATE (PF) 2 MG/ML IV SOLN
2.0000 mg | Freq: Once | INTRAVENOUS | Status: AC
  Administered 2016-11-27: 2 mg via INTRAVENOUS
  Filled 2016-11-27: qty 1

## 2016-11-27 MED ORDER — ASPIRIN EC 81 MG PO TBEC
81.0000 mg | DELAYED_RELEASE_TABLET | Freq: Every day | ORAL | Status: DC
  Administered 2016-11-27 – 2016-12-01 (×5): 81 mg via ORAL
  Filled 2016-11-27 (×5): qty 1

## 2016-11-27 MED ORDER — AMITRIPTYLINE HCL 25 MG PO TABS
25.0000 mg | ORAL_TABLET | Freq: Every day | ORAL | Status: DC
Start: 2016-11-27 — End: 2016-12-01
  Administered 2016-11-27 – 2016-11-30 (×4): 25 mg via ORAL
  Filled 2016-11-27 (×4): qty 1

## 2016-11-27 MED ORDER — SODIUM CHLORIDE 0.9% FLUSH
3.0000 mL | Freq: Two times a day (BID) | INTRAVENOUS | Status: DC
  Administered 2016-11-27 – 2016-11-30 (×7): 3 mL via INTRAVENOUS

## 2016-11-27 MED ORDER — SENNA 8.6 MG PO TABS
1.0000 | ORAL_TABLET | Freq: Every evening | ORAL | Status: DC | PRN
  Administered 2016-11-27: 8.6 mg via ORAL

## 2016-11-27 MED ORDER — MORPHINE SULFATE (PF) 4 MG/ML IV SOLN
1.0000 mg | INTRAVENOUS | Status: DC | PRN
  Administered 2016-11-27: 1 mg via INTRAVENOUS
  Filled 2016-11-27: qty 1

## 2016-11-27 MED ORDER — IOPAMIDOL (ISOVUE-300) INJECTION 61%
INTRAVENOUS | Status: AC
  Administered 2016-11-27: 100 mL
  Filled 2016-11-27: qty 100

## 2016-11-27 MED ORDER — POLYETHYLENE GLYCOL 3350 17 G PO PACK
17.0000 g | PACK | Freq: Two times a day (BID) | ORAL | Status: DC
  Administered 2016-11-27 – 2016-11-30 (×3): 17 g via ORAL
  Filled 2016-11-27 (×7): qty 1

## 2016-11-27 MED ORDER — ACETAMINOPHEN 325 MG PO TABS
650.0000 mg | ORAL_TABLET | Freq: Four times a day (QID) | ORAL | Status: DC | PRN
  Administered 2016-11-30 – 2016-12-01 (×3): 650 mg via ORAL
  Filled 2016-11-27 (×3): qty 2

## 2016-11-27 MED ORDER — PROMETHAZINE HCL 25 MG/ML IJ SOLN
12.5000 mg | Freq: Once | INTRAMUSCULAR | Status: AC
  Administered 2016-11-27: 12.5 mg via INTRAVENOUS
  Filled 2016-11-27: qty 1

## 2016-11-27 MED ORDER — FLEET ENEMA 7-19 GM/118ML RE ENEM
1.0000 | ENEMA | Freq: Once | RECTAL | Status: AC
  Administered 2016-11-27: 1 via RECTAL
  Filled 2016-11-27: qty 1

## 2016-11-27 MED ORDER — ONDANSETRON HCL 4 MG/2ML IJ SOLN
4.0000 mg | Freq: Three times a day (TID) | INTRAMUSCULAR | Status: DC | PRN
  Administered 2016-11-30 (×2): 4 mg via INTRAVENOUS
  Filled 2016-11-27 (×2): qty 2

## 2016-11-27 MED ORDER — ACETAMINOPHEN 650 MG RE SUPP: 650.0000 mg | Freq: Four times a day (QID) | RECTAL | Status: DC | PRN

## 2016-11-27 MED ORDER — NICOTINE 21 MG/24HR TD PT24
21.0000 mg | MEDICATED_PATCH | Freq: Every day | TRANSDERMAL | Status: DC
  Filled 2016-11-27 (×3): qty 1

## 2016-11-27 NOTE — ED Notes (Addendum)
After enema, pt had urge to use bathroom but pt had no relief. No bowel movement progress. Pt given ice chips in the mean time.

## 2016-11-27 NOTE — Progress Notes (Signed)
[facility] PLAN OF CARE NOTE Patient: Michele Lewis [id number]   PCP: Antony Blackbird, MD DOB: 1941-03-13   DOA: 11/26/2016   DOS: 11/27/2016    Patient was admitted by my colleague Dr. Blaine Hamper earlier on 11/27/2016. I have reviewed the H&P as well as assessment and plan and agree with the same. Important changes in the plan are listed below.  Plan of care: Principal Problem:   Abdominal pain Active Problems:   Gastroesophageal reflux disease without esophagitis   Essential hypertension   Tobacco abuse   Back pain   Nausea vomiting and diarrhea   Acute renal failure superimposed on stage 3 chronic kidney disease ([facility]) Abdominal pain due to severe constipation. Finally improving with enema. I will continue MiraLAX on a scheduled basis and will use when necessary Colace as well as Senokot. Patient's abdominal pain is out of proportion to her presentation, imaging as well as left findings. Appears more superficial as well. I would advance to clear liquid diet. Start on scheduled amitriptyline for neuropathic pain. If the patient tolerates clear liquid diet and advance to full liquid diet for dinner. Leukocytosis initially worsened symptomatic getting better. Continue IV fluids.  Author: Berle Mull, MD [facility] Pager: [phone] 11/27/2016 1:11 PM   If 7PM-7AM, please contact night-coverage at [email or web address], password [facility]

## 2016-11-27 NOTE — H&P (Addendum)
History and Physical    Michele Lewis [id number] DOB: 1941/05/21 DOA: 11/26/2016  Referring MD/NP/PA:   PCP: Antony Blackbird, MD   Patient coming from:  The patient is coming from home.  At baseline, pt is independent for most of ADL.  Chief Complaint: Nausea, vomiting, diarrhea, abdominal pain  HPI: AMIYLA Lewis is a 76 y.o. female with medical history significant of hypertension, GERD, depression, back pain, tobacco abuse, CKD-3, who presents with nausea, vomiting, diarrhea and abdominal pain.  Patient states that she started having nausea, vomiting, diarrhea last Saturday. She was seen by her PCP on Wednesday, and was given antinausea and diarrhea medications. Her diarrhea has resolved on Tuesday, but she continues to have nausea and vomiting. Mostly dry heaves, but she had one big vomiting today. Her last bowel movement was on Tuesday. She does not have runny nose, sore throat or body aches. Patient states that she started having abdominal pain today. It is located in the lower abdomen, constant, sharp, 10 out of 10 in severity, nonradiating. Patient does not have fever or chills. No hematemesis or hematuria or hematochezia. Patient does not have chest pain, shortness rest, cough, unilateral weakness.  ED Course: pt was found to have WBC 14.5, lipase 23, negative urinalysis, worsening renal function, temperature normal, oxygen saturation 98% on room air.  Pt is placed on med-surg bed for obs.  # X-ray of acute abdomen/chest showed mphysematous changes and chronic bronchitic changes in the lungs, also has gas-filled dilated central mid abdominal small bowel with air-fluid levels suggesting early small bowel obstruction.  # CT-abd/pelvis showed fluid-filled distention of large bowel from cecum to sigmoid colon where there is a moderate to large stool burden noted within the sigmoid contributing to this distention. No focal mass nor annular constricting lesion is apparent. There is  sigmoid diverticulosis without acute inflammation. Slight increase in periportal edema, nonspecific since prior exam. Findings can be seen in hypervolemia, passive hepatic congestion, hepatitis or cholangitis among bold some possibilities.  Review of Systems:   General: no fevers, chills, no changes in body weight, has poor appetite, has fatigue HEENT: no blurry vision, hearing changes or sore throat Respiratory: no dyspnea, coughing, wheezing CV: no chest pain, no palpitations GI: has nausea, vomiting, abdominal pain, diarrhea, constipation GU: no dysuria, burning on urination, increased urinary frequency, hematuria  Ext: no leg edema Neuro: no unilateral weakness, numbness, or tingling, no vision change or hearing loss Skin: no rash, no skin tear. MSK: No muscle spasm, no deformity, no limitation of range of movement in spin Heme: No easy bruising.  Travel history: No recent long distant travel.  Allergy:  Allergies  Allergen Reactions  . Sulfa Antibiotics Other (See Comments)    Get sicker with whatever illness is that it's supposed to treat    Past Medical History:  Diagnosis Date  . Anxiety   . 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 Back pain   . Dysrhythmia    every once in a while feel that her heart skips a beat   . GERD (gastroesophageal reflux disease)    reports barium swallow- 12/2011- has evidence of stricture, spasm  . History of nuclear stress test    2002- normal   . Hypertension   . No significant past medical history   . Tobacco abuse     Past Surgical History:  Procedure Laterality Date  . childbirth x5 - all vaginal     . EYE SURGERY  cataracts - bilateral removed- /w IOL  . LUMBAR LAMINECTOMY  06/14/2012   Procedure: MICRODISCECTOMY LUMBAR LAMINECTOMY;  Surgeon: Marybelle Killings, MD;  Location: [facility];  Service: Orthopedics;  Laterality: Left;  Left L4-5, L5-S1 Microdiscectomy, Hemilaminectomy, Foraminotomy  . TONSILLECTOMY     as a child      Social History:  reports that she has been smoking Cigarettes.  She has a 25.00 pack-year smoking history. She has never used smokeless tobacco. She reports that she drinks alcohol. She reports that she does not use drugs.  Family History:  Family History  Problem Relation Age of Onset  . Heart disease Father   . Hypertension Father   . Heart disease Mother   . Coronary artery disease Mother   . Hyperlipidemia Sister   . Hypertension Sister      Prior to Admission medications   Medication Sig Start Date End Date Taking? Authorizing Provider  aspirin EC 81 MG tablet Take 81 mg by mouth daily.   Yes Historical Provider, MD  lansoprazole (PREVACID) 30 MG capsule Take 30 mg by mouth daily with supper.    Yes Historical Provider, MD  naproxen sodium (ALEVE) 220 MG tablet Take 110 mg by mouth daily as needed (pain/headache).   Yes Historical Provider, MD  ondansetron (ZOFRAN) 8 MG tablet Take 8 mg by mouth every 8 (eight) hours as needed. 11/22/16  Yes Historical Provider, MD  valsartan (DIOVAN) 160 MG tablet Take 160 mg by mouth daily. 10/15/16  Yes Historical Provider, MD    Physical Exam: Vitals:   11/26/16 2134 11/26/16 2230 11/26/16 2300  BP: 182/91 162/91 185/93  Pulse: 90 76 96  Resp: 16    Temp: 97.6 F (36.4 C)    TempSrc: Oral    SpO2: 98% 96% 92%   General: Not in acute distress HEENT:       Eyes: PERRL, EOMI, no scleral icterus.       ENT: No discharge from the ears and nose, no pharynx injection, no tonsillar enlargement.        Neck: No JVD, no bruit, no mass felt. Heme: No neck lymph node enlargement. Cardiac: S1/S2, RRR, No murmurs, No gallops or rubs. Respiratory: No rales, wheezing, rhonchi or rubs. GI: Soft, nondistended, has severe tenderness in lower abdomen, no rebound pain, no organomegaly, BS present. GU: No hematuria Ext: No pitting leg edema bilaterally. 2+DP/PT pulse bilaterally. Musculoskeletal: No joint deformities, No joint redness or warmth, no  limitation of ROM in spin. Skin: No rashes.  Neuro: Alert, oriented X3, cranial nerves II-XII grossly intact, moves all extremities normally. Muscle strength 5/5 in all extremities, sensation to light touch intact. Brachial reflex 2+ bilaterally. Knee reflex 1+ bilaterally. Negative Babinski's sign. Normal finger to nose test. Psych: Patient is not psychotic, no suicidal or hemocidal ideation.  Labs on Admission: I have personally reviewed following labs and imaging studies  CBC:  Recent Labs Lab 11/26/16 2142  WBC 14.5*  HGB 14.0  HCT 42.0  MCV 83.2  PLT 123456   Basic Metabolic Panel:  Recent Labs Lab 11/26/16 2142  NA 136  K 4.0  CL 101  CO2 24  GLUCOSE 136*  BUN 22*  CREATININE 1.24*  CALCIUM 9.9   GFR: CrCl cannot be calculated (Unknown ideal weight.). Liver Function Tests:  Recent Labs Lab 11/26/16 2142  AST 21  ALT 14  ALKPHOS 74  BILITOT 0.6  PROT 7.0  ALBUMIN 4.0    Recent Labs Lab 11/26/16 2142  LIPASE 23   No results for input(s): AMMONIA in the last 168 hours. Coagulation Profile: No results for input(s): INR, PROTIME in the last 168 hours. Cardiac Enzymes: No results for input(s): CKTOTAL, CKMB, CKMBINDEX, TROPONINI in the last 168 hours. BNP (last 3 results) No results for input(s): PROBNP in the last 8760 hours. HbA1C: No results for input(s): HGBA1C in the last 72 hours. CBG: No results for input(s): GLUCAP in the last 168 hours. Lipid Profile: No results for input(s): CHOL, HDL, LDLCALC, TRIG, CHOLHDL, LDLDIRECT in the last 72 hours. Thyroid Function Tests: No results for input(s): TSH, T4TOTAL, FREET4, T3FREE, THYROIDAB in the last 72 hours. Anemia Panel: No results for input(s): VITAMINB12, FOLATE, FERRITIN, TIBC, IRON, RETICCTPCT in the last 72 hours. Urine analysis:    Component Value Date/Time   COLORURINE YELLOW 11/26/2016 2142   APPEARANCEUR CLEAR 11/26/2016 2142   LABSPEC 1.015 11/26/2016 2142   PHURINE 5.0 11/26/2016  2142   GLUCOSEU NEGATIVE 11/26/2016 2142   HGBUR SMALL (A) 11/26/2016 2142   BILIRUBINUR NEGATIVE 11/26/2016 2142   KETONESUR 5 (A) 11/26/2016 2142   PROTEINUR NEGATIVE 11/26/2016 2142   NITRITE NEGATIVE 11/26/2016 2142   LEUKOCYTESUR NEGATIVE 11/26/2016 2142   Sepsis Labs: @LABRCNTIP (procalcitonin:4,lacticidven:4) )No results found for this or any previous visit (from the past 240 hour(s)).   Radiological Exams on Admission: Ct Abdomen Pelvis W Contrast  Result Date: 11/27/2016 CLINICAL DATA:  Abdominal pain and vomiting. EXAM: CT ABDOMEN AND PELVIS WITH CONTRAST TECHNIQUE: Multidetector CT imaging of the abdomen and pelvis was performed using the standard protocol following bolus administration of intravenous contrast. CONTRAST:  21mL ISOVUE-300 IOPAMIDOL (ISOVUE-300) INJECTION 61% COMPARISON:  None. FINDINGS: Lower chest: Bibasilar atelectasis. Small hiatal hernia. Normal size cardiac chambers without pericardial effusion. Hepatobiliary: Slight increase in periportal edema. No space-occupying mass of the liver. Gallbladder is physiologically distended without calculus. Pancreas: Negative Spleen: Negative Adrenals/Urinary Tract: Normal bilateral adrenal glands. No obstructive uropathy or enhancing renal mass. Unremarkable bladder. Stomach/Bowel: Stomach is moderately distended with fluid. No small bowel dilatation. Normal small bowel rotation. There is fluid-filled distention of large bowel with cecum measuring up to 6.4 cm in caliber extending to the level of the sigmoid colon with there is moderate to large colonic stool burden and diverticulosis of the sigmoid. No focal inflammation. Mild distal tapering the rectosigmoid. Vascular/Lymphatic: Prominent left periuterine vessels. Aortoiliac atherosclerosis without aneurysm. Reproductive: Uterus and adnexa demonstrate no acute appearing abnormalities. Other: No free air nor free fluid. Musculoskeletal: Mild fat containing left inguinal hernia.  Degenerative disc disease most marked at L4-5 and L5-S1. Left L5 laminectomy. No acute osseous abnormality. IMPRESSION: Fluid-filled distention of large bowel from cecum to sigmoid colon where there is a moderate to large stool burden noted within the sigmoid contributing to this distention. No focal mass nor annular constricting lesion is apparent. There is sigmoid diverticulosis without acute inflammation. Slight increase in periportal edema, nonspecific since prior exam. Findings can be seen in hypervolemia, passive hepatic congestion, hepatitis or cholangitis among bold some possibilities. Electronically Signed   By: Ashley Royalty M.D.   On: 11/27/2016 01:11   Dg Abdomen Acute W/chest  Result Date: 11/27/2016 CLINICAL DATA:  Mid abdominal pain since Sunday. Nausea and vomiting. EXAM: DG ABDOMEN ACUTE W/ 1V CHEST COMPARISON:  Chest 07/26/2016.  CT abdomen and pelvis 09/07/2015 FINDINGS: Normal heart size and pulmonary vascularity. Emphysematous changes in the lungs with central interstitial pattern suggesting chronic bronchitis. No focal airspace disease or consolidation. No blunting of costophrenic angles. No  pneumothorax. Calcification of the aorta. Gas-filled mildly dilated central mid abdominal small bowel loops. Multiple air-fluid levels demonstrated centrally. Appearance suggests early small bowel obstruction. No free intra- abdominal air. No radiopaque stones. Degenerative changes in the spine and hips. IMPRESSION: Emphysematous changes and chronic bronchitic changes in the lungs. Gas-filled dilated central mid abdominal small bowel with air-fluid levels suggesting early small bowel obstruction. Electronically Signed   By: Lucienne Capers M.D.   On: 11/27/2016 00:54     EKG: Not done in ED, will get one.   Assessment/Plan Principal Problem:   Abdominal pain Active Problems:   Gastroesophageal reflux disease without esophagitis   Essential hypertension   Tobacco abuse   Back pain   Nausea  vomiting and diarrhea   Acute renal failure superimposed on stage 3 chronic kidney disease (HCC)   Nausea, vomiting, diarrhea and  abdominal pain: Patient's nausea, vomiting and diarrhea are likely caused by viral gastroenteritis. CT abdomen/pelvis did not showed colitis. Her abdominal pain is likely caused by large stool burden in colon as shown by CT scan. No mass or SBO by CT scan.  -will place on surg bed for obs -pt was treated with one dose of FLEET enema -will start MiraLAX and senokot -When necessary Zofran for nausea and morphine for pain -IV fluids: 2 L normal saline, then 100 mL per hour  GERD: on PPI -switch to Pepcid IV  HTN: -Hold Diovan due to worsening renal function -IV hydralazine when necessary  Tobacco abuse: -Did counseling about importance of quitting smoking -Nicotine patch  Chronic back pain: -hold naproxen due to worsening renal function When necessary Tylenol  AoCKD-III: Baseline Cre is 1.05, pt's Cre is 1.24 on admission. Likely due to prerenal secondary to dehydration and continuation of ARB, NSAIDs. - IVF as above - Follow up renal function by BMP - Hold naproxen and Diovan  DVT ppx:  SQ Lovenox Code Status: Full code Family Communication:  Yes, patient's son and daughter at bed side Disposition Plan:  Anticipate discharge back to previous home environment Consults called:  none Admission status:medical floor/obs   Date of Service 11/27/2016    Ivor Costa [facility] Pager [phone]  If 7PM-7AM, please contact night-coverage [email or web address] Password [email or web address] 11/27/2016, 3:02 AM

## 2016-11-28 ENCOUNTER — Inpatient Hospital Stay (HOSPITAL_COMMUNITY): Payer: Medicare HMO

## 2016-11-28 DIAGNOSIS — K529 Noninfective gastroenteritis and colitis, unspecified: Secondary | ICD-10-CM

## 2016-11-28 LAB — CBC WITH DIFFERENTIAL/PLATELET
BASOS PCT: 0 %
Basophils Absolute: 0 10*3/uL (ref 0.0–0.1)
EOS PCT: 0 %
Eosinophils Absolute: 0 10*3/uL (ref 0.0–0.7)
HCT: 40 % (ref 36.0–46.0)
Hemoglobin: 13.1 g/dL (ref 12.0–15.0)
LYMPHS ABS: 1 10*3/uL (ref 0.7–4.0)
Lymphocytes Relative: 9 %
MCH: 27.1 pg (ref 26.0–34.0)
MCHC: 32.8 g/dL (ref 30.0–36.0)
MCV: 82.8 fL (ref 78.0–100.0)
MONO ABS: 0.6 10*3/uL (ref 0.1–1.0)
MONOS PCT: 5 %
Neutro Abs: 9.5 10*3/uL — ABNORMAL HIGH (ref 1.7–7.7)
Neutrophils Relative %: 86 %
PLATELETS: 213 10*3/uL (ref 150–400)
RBC: 4.83 MIL/uL (ref 3.87–5.11)
RDW: 13.7 % (ref 11.5–15.5)
WBC MORPHOLOGY: INCREASED
WBC: 11.1 10*3/uL — AB (ref 4.0–10.5)

## 2016-11-28 LAB — COMPREHENSIVE METABOLIC PANEL
ALT: 14 U/L (ref 14–54)
AST: 21 U/L (ref 15–41)
Albumin: 2.3 g/dL — ABNORMAL LOW (ref 3.5–5.0)
Alkaline Phosphatase: 35 U/L — ABNORMAL LOW (ref 38–126)
Anion gap: 13 (ref 5–15)
BUN: 31 mg/dL — AB (ref 6–20)
CO2: 17 mmol/L — ABNORMAL LOW (ref 22–32)
CREATININE: 1.29 mg/dL — AB (ref 0.44–1.00)
Calcium: 8 mg/dL — ABNORMAL LOW (ref 8.9–10.3)
Chloride: 109 mmol/L (ref 101–111)
GFR calc Af Amer: 46 mL/min — ABNORMAL LOW (ref >60)
GFR, EST NON AFRICAN AMERICAN: 39 mL/min — AB (ref >60)
Glucose, Bld: 96 mg/dL (ref 65–99)
Potassium: 4.3 mmol/L (ref 3.5–5.1)
Sodium: 139 mmol/L (ref 135–145)
TOTAL PROTEIN: 4.6 g/dL — AB (ref 6.5–8.1)
Total Bilirubin: 0.7 mg/dL (ref 0.3–1.2)

## 2016-11-28 LAB — MAGNESIUM: MAGNESIUM: 1.6 mg/dL — AB (ref 1.7–2.4)

## 2016-11-28 MED ORDER — MAGNESIUM SULFATE 2 GM/50ML IV SOLN
2.0000 g | Freq: Once | INTRAVENOUS | Status: AC
  Administered 2016-11-28: 2 g via INTRAVENOUS
  Filled 2016-11-28: qty 50

## 2016-11-28 MED ORDER — CEFTRIAXONE SODIUM 1 G IJ SOLR
1.0000 g | INTRAMUSCULAR | Status: DC
Start: 2016-11-28 — End: 2016-12-01
  Administered 2016-11-28 – 2016-11-30 (×2): 1 g via INTRAVENOUS
  Filled 2016-11-28 (×4): qty 10

## 2016-11-28 MED ORDER — METRONIDAZOLE IN NACL 5-0.79 MG/ML-% IV SOLN
500.0000 mg | Freq: Three times a day (TID) | INTRAVENOUS | Status: DC
  Administered 2016-11-28 – 2016-11-29 (×3): 500 mg via INTRAVENOUS
  Filled 2016-11-28 (×4): qty 100

## 2016-11-28 MED ORDER — FAMOTIDINE 20 MG PO TABS
20.0000 mg | ORAL_TABLET | Freq: Two times a day (BID) | ORAL | Status: DC
  Administered 2016-11-28 – 2016-12-01 (×6): 20 mg via ORAL
  Filled 2016-11-28 (×6): qty 1

## 2016-11-28 MED ORDER — METHOCARBAMOL 500 MG PO TABS
500.0000 mg | ORAL_TABLET | Freq: Three times a day (TID) | ORAL | Status: DC | PRN
  Administered 2016-12-01 (×2): 500 mg via ORAL
  Filled 2016-11-28 (×2): qty 1

## 2016-11-28 NOTE — Progress Notes (Signed)
[facility] Progress Note  Patient: Michele Lewis IRW:[id number]   PCP: Antony Blackbird, MD DOB: Jun 01, 1941   DOA: 11/26/2016   DOS: 11/28/2016   Date of Service: the patient was seen and examined on 11/28/2016   Subjective: Feeling better but continues to have superficial abdominal pain. No nausea or vomiting. Continues to have diarrhea. Less Frequent  Brief hospital course: Pt. with PMH of hypertension, active smoker; admitted on 11/26/2016, with complaint of nausea vomiting and diarrhea with abdominal pain, was found to have enteritis with severe constipation. Currently further plan is continue monitoring of response to treatment supportive management.  Assessment and Plan: 1. Enteritis Presented with nausea vomiting and diarrhea. Has seen PCP as an outpatient was given symptomatic treatment for that. Did not have any improvement and having severe abdominal pain. CT of the abdomen was showing constipation without any evidence of obstruction. Constipation resolved with Fleet's enema and have some diarrhea. X-ray of the abdomen this morning shows multiple air-fluid levels although no definite obstruction identified. Possible enteritis suggested. Has leukocytosis on admission which is getting better but has increased bandemia. Given this I would start the patient on ceftriaxone and Flagyl and monitor. Will finish a 7 day treatment course. Since the patient is tolerating clear liquids, advance to full liquids. Continue MiraLAX and when necessary stool softeners ESR and CRP in the morning.  2. Essential hypertension. Currently according oral hypertensive medications.  3. GERD. Patient was on IV Pepcid, I would switch to oral Pepcid.  4. Hypomagnesemia. Replacing. Next and recheck tomorrow.  5. Superficial abdominal pain. Along with colicky pain the patient also has superficial continuous abdominal pain. Amitriptyline started and the patient is feeling improvement. Add  Robaxin.  6. Mild renal dysfunction. Continue IV fluids.  Bowel regimen: last BM 11/28/2016 Diet: full liquid diet DVT Prophylaxis: subcutaneous Heparin  Advance goals of care discussion: Full code  Family Communication: no family was present at bedside, at the time of interview.   Disposition:  Discharge to home Expected discharge date: 11/30/2016.Pending diet tolerance  Consultants: none Procedures: none  Antibiotics: Anti-infectives    Start     Dose/Rate Route Frequency Ordered Stop   11/28/16 1400  cefTRIAXone (ROCEPHIN) 1 g in dextrose 5 % 50 mL IVPB     1 g 100 mL/hr over 30 Minutes Intravenous Every 24 hours 11/28/16 1240     11/28/16 1300  metroNIDAZOLE (FLAGYL) IVPB 500 mg     500 mg 100 mL/hr over 60 Minutes Intravenous Every 8 hours 11/28/16 1236          Objective: Physical Exam: Vitals:   11/27/16 0415 11/27/16 1428 11/27/16 2044 11/28/16 0406  BP: (!) 161/86 106/65 (!) 115/55 (!) 111/53  Pulse: 83 (!) 101 96 100  Resp: 18 17 16 18   Temp: 98.4 F (36.9 C) 99.7 F (37.6 C) 97.9 F (36.6 C) 98.5 F (36.9 C)  TempSrc: Oral Oral Oral Oral  SpO2: 97% 98% 94% 100%  Weight: 62 kg (136 lb 11 oz)     Height: 5' 5"  (1.651 m)       Intake/Output Summary (Last 24 hours) at 11/28/16 1411 Last data filed at 11/28/16 0900  Gross per 24 hour  Intake             1000 ml  Output                0 ml  Net             1000  ml   Filed Weights   11/27/16 0415  Weight: 62 kg (136 lb 11 oz)    General: Alert, Awake and Oriented to Time, Place and Person. Appear in mild distress, affect appropriate Eyes: PERRL, Conjunctiva normal ENT: Oral Mucosa clear moist. Neck: no JVD, no Abnormal Mass Or lumps Cardiovascular: S1 and S2 Present, no Murmur, Respiratory: Bilateral Air entry equal and Decreased, no use of accessory muscle, Clear to Auscultation, no Crackles, no wheezes Abdomen: Bowel Sound present, Soft and diffuse tenderness Skin: no redness, no Rash, no  induration Extremities: no Pedal edema, no calf tenderness Neurologic: Grossly no focal neuro deficit. Bilaterally Equal motor strength  Data Reviewed: CBC:  Recent Labs Lab 11/26/16 2142 11/27/16 0328 11/27/16 0620 11/28/16 0336  WBC 14.5* 20.9* 16.0* 11.1*  NEUTROABS  --   --  14.7* 9.5*  HGB 14.0 15.2* 15.0 13.1  HCT 42.0 45.9 46.6* 40.0  MCV 83.2 83.5 83.5 82.8  PLT 304 281 265 701   Basic Metabolic Panel:  Recent Labs Lab 11/26/16 2142 11/27/16 0328 11/28/16 0336  NA 136 136 139  K 4.0 4.1 4.3  CL 101 103 109  CO2 24 19* 17*  GLUCOSE 136* 167* 96  BUN 22* 23* 31*  CREATININE 1.24* 1.18* 1.29*  CALCIUM 9.9 9.4 8.0*  MG  --   --  1.6*    Liver Function Tests:  Recent Labs Lab 11/26/16 2142 11/28/16 0336  AST 21 21  ALT 14 14  ALKPHOS 74 35*  BILITOT 0.6 0.7  PROT 7.0 4.6*  ALBUMIN 4.0 2.3*    Recent Labs Lab 11/26/16 2142  LIPASE 23   No results for input(s): AMMONIA in the last 168 hours. Coagulation Profile: No results for input(s): INR, PROTIME in the last 168 hours. Cardiac Enzymes: No results for input(s): CKTOTAL, CKMB, CKMBINDEX, TROPONINI in the last 168 hours. BNP (last 3 results) No results for input(s): PROBNP in the last 8760 hours.  CBG: No results for input(s): GLUCAP in the last 168 hours.  Studies: Dg Abd Acute W/chest  Result Date: 11/28/2016 CLINICAL DATA:  Lower abdominal pain. EXAM: DG ABDOMEN ACUTE W/ 1V CHEST COMPARISON:  CT scan and x-rays dated 11/27/2016 FINDINGS: Minimal new atelectasis at the left lung base. There is increased air in the nondistended colon with multiple air-fluid levels in the upright radiograph. No dilated small bowel. No free air. IMPRESSION: Increased air in the nondistended colon. The finding is most typical for enteritis with multiple air-fluid levels. Does the patient have bowel sounds? New slight atelectasis at the left lung base. Electronically Signed   By: Lorriane Shire M.D.   On:  11/28/2016 09:36     Scheduled Meds: . amitriptyline  25 mg Oral QHS  . aspirin EC  81 mg Oral Daily  . cefTRIAXone (ROCEPHIN)  IV  1 g Intravenous Q24H  . enoxaparin (LOVENOX) injection  30 mg Subcutaneous Q24H  . famotidine  20 mg Oral BID  . metronidazole  500 mg Intravenous Q8H  . nicotine  21 mg Transdermal Daily  . polyethylene glycol  17 g Oral BID  . sodium chloride flush  3 mL Intravenous Q12H   Continuous Infusions: . sodium chloride 100 mL/hr at 11/28/16 0133   PRN Meds: acetaminophen **OR** acetaminophen, docusate sodium, hydrALAZINE, methocarbamol, morphine injection, ondansetron (ZOFRAN) IV, senna, zolpidem  Time spent: 30 minutes  Author: Berle Mull, MD [facility] Pager: [phone] 11/28/2016 2:11 PM  If 7PM-7AM, please contact night-coverage at [email or web address], password [facility]

## 2016-11-29 LAB — COMPREHENSIVE METABOLIC PANEL
ALT: 13 U/L — AB (ref 14–54)
AST: 19 U/L (ref 15–41)
Albumin: 2.1 g/dL — ABNORMAL LOW (ref 3.5–5.0)
Alkaline Phosphatase: 49 U/L (ref 38–126)
Anion gap: 10 (ref 5–15)
BUN: 20 mg/dL (ref 6–20)
CHLORIDE: 106 mmol/L (ref 101–111)
CO2: 20 mmol/L — AB (ref 22–32)
CREATININE: 1.12 mg/dL — AB (ref 0.44–1.00)
Calcium: 7.6 mg/dL — ABNORMAL LOW (ref 8.9–10.3)
GFR calc non Af Amer: 47 mL/min — ABNORMAL LOW (ref >60)
GFR, EST AFRICAN AMERICAN: 54 mL/min — AB (ref >60)
Glucose, Bld: 86 mg/dL (ref 65–99)
Potassium: 3.9 mmol/L (ref 3.5–5.1)
SODIUM: 136 mmol/L (ref 135–145)
Total Bilirubin: 0.3 mg/dL (ref 0.3–1.2)
Total Protein: 4.5 g/dL — ABNORMAL LOW (ref 6.5–8.1)

## 2016-11-29 LAB — CBC WITH DIFFERENTIAL/PLATELET
BASOS ABS: 0 10*3/uL (ref 0.0–0.1)
BASOS PCT: 0 %
EOS ABS: 0 10*3/uL (ref 0.0–0.7)
Eosinophils Relative: 0 %
HCT: 34.5 % — ABNORMAL LOW (ref 36.0–46.0)
Hemoglobin: 11.5 g/dL — ABNORMAL LOW (ref 12.0–15.0)
Lymphocytes Relative: 7 %
Lymphs Abs: 0.8 10*3/uL (ref 0.7–4.0)
MCH: 26.8 pg (ref 26.0–34.0)
MCHC: 33.3 g/dL (ref 30.0–36.0)
MCV: 80.4 fL (ref 78.0–100.0)
MONO ABS: 0.2 10*3/uL (ref 0.1–1.0)
Monocytes Relative: 2 %
NEUTROS PCT: 91 %
Neutro Abs: 10.5 10*3/uL — ABNORMAL HIGH (ref 1.7–7.7)
PLATELETS: 204 10*3/uL (ref 150–400)
RBC: 4.29 MIL/uL (ref 3.87–5.11)
RDW: 14.3 % (ref 11.5–15.5)
WBC: 11.5 10*3/uL — AB (ref 4.0–10.5)

## 2016-11-29 LAB — MAGNESIUM: Magnesium: 1.8 mg/dL (ref 1.7–2.4)

## 2016-11-29 LAB — SEDIMENTATION RATE: Sed Rate: 41 mm/hr — ABNORMAL HIGH (ref 0–22)

## 2016-11-29 LAB — C-REACTIVE PROTEIN: CRP: 30.7 mg/dL — AB (ref <1.0)

## 2016-11-29 MED ORDER — ENOXAPARIN SODIUM 40 MG/0.4ML ~~LOC~~ SOLN
40.0000 mg | SUBCUTANEOUS | Status: DC
  Administered 2016-11-29 – 2016-11-30 (×2): 40 mg via SUBCUTANEOUS
  Filled 2016-11-29: qty 0.4

## 2016-11-29 MED ORDER — METRONIDAZOLE 500 MG PO TABS
500.0000 mg | ORAL_TABLET | Freq: Three times a day (TID) | ORAL | Status: DC
  Administered 2016-11-29 – 2016-12-01 (×5): 500 mg via ORAL
  Filled 2016-11-29 (×5): qty 1

## 2016-11-29 MED ORDER — PSYLLIUM 95 % PO PACK
1.0000 | PACK | Freq: Every day | ORAL | Status: DC
  Administered 2016-11-30: 1 via ORAL
  Filled 2016-11-29 (×3): qty 1

## 2016-11-29 NOTE — Progress Notes (Signed)
[facility] Progress Note  Patient: Michele Lewis YQM:[id number]   PCP: Antony Blackbird, MD DOB: 1941-07-15   DOA: 11/26/2016   DOS: 11/29/2016   Date of Service: the patient was seen and examined on 11/29/2016   Subjective: Feeling better but continues to have abdominal pain. No nausea or vomiting. Not passed gas this AM no BM as well   Brief hospital course: Pt. with PMH of hypertension, active smoker; admitted on 11/26/2016, with complaint of nausea vomiting and diarrhea with abdominal pain, was found to have enteritis with severe constipation. Currently further plan is continue monitoring of response to treatment supportive management.  Assessment and Plan: 1. Enteritis with ileus. Constipation. Presented with nausea vomiting and diarrhea. Has seen PCP as an outpatient was given symptomatic treatment for that. Did not have any improvement and having severe abdominal pain. CT of the abdomen was showing constipation without any evidence of obstruction. Constipation resolved with Fleet's enema and have some diarrhea. X-ray of the abdomen shows multiple air-fluid levels although no definite obstruction identified. Possible enteritis suggested. Has leukocytosis on admission which is getting better but has increased bandemia. Given this pt was started on ceftriaxone and Flagyl. Seems doing better with improvement in pain and bandemia. Will finish a 7 day treatment course. Continue full liquids. Continue MiraLAX and when necessary stool softeners, add metamucil ESR and CRP significantly elevated  2. Essential hypertension. Currently holding oral hypertensive medications.  3. GERD. Patient was on IV Pepcid, I would switch to oral Pepcid.  4. Hypomagnesemia. Monitor  5. Superficial abdominal pain. Along with colicky pain the patient also has superficial continuous abdominal pain. Amitriptyline started and the patient is feeling improvement. Add Robaxin.  6. Mild renal  dysfunction. Continue IV fluids.  Bowel regimen: last BM 11/28/2016 Diet: full liquid diet DVT Prophylaxis: subcutaneous Heparin  Advance goals of care discussion: Full code  Family Communication: no family was present at bedside, at the time of interview.   Disposition:  Discharge to home Expected discharge date: 12/01/2016. Pending diet tolerance  Consultants: none Procedures: none  Antibiotics: Anti-infectives    Start     Dose/Rate Route Frequency Ordered Stop   11/29/16 1400  metroNIDAZOLE (FLAGYL) tablet 500 mg     500 mg Oral Every 8 hours 11/29/16 1331     11/28/16 1400  cefTRIAXone (ROCEPHIN) 1 g in dextrose 5 % 50 mL IVPB     1 g 100 mL/hr over 30 Minutes Intravenous Every 24 hours 11/28/16 1240     11/28/16 1300  metroNIDAZOLE (FLAGYL) IVPB 500 mg  Status:  Discontinued     500 mg 100 mL/hr over 60 Minutes Intravenous Every 8 hours 11/28/16 1236 11/29/16 1331      Objective: Physical Exam: Vitals:   11/28/16 0406 11/28/16 1447 11/28/16 2000 11/29/16 0400  BP: (!) 111/53 (!) 116/57 127/90 (!) 127/56  Pulse: 100 71 (!) 105 94  Resp: 18 17    Temp: 98.5 F (36.9 C) 99.1 F (37.3 C) 98.3 F (36.8 C) 98.5 F (36.9 C)  TempSrc: Oral Oral Oral Oral  SpO2: 100% 100% 100% 96%  Weight:      Height:        Intake/Output Summary (Last 24 hours) at 11/29/16 1601 Last data filed at 11/29/16 0930  Gross per 24 hour  Intake          4586.67 ml  Output                0 ml  Net          4586.67 ml   Filed Weights   11/27/16 0415  Weight: 62 kg (136 lb 11 oz)    General: Alert, Awake and Oriented to Time, Place and Person. Appear in mild distress, affect appropriate ENT: Oral Mucosa clear moist. Cardiovascular: S1 and S2 Present, no Murmur, Respiratory: Bilateral Air entry equal and Decreased, no use of accessory muscle, Clear to Auscultation, no Crackles, no wheezes Abdomen: Bowel Sound present,sluggish, Soft and diffuse tenderness Extremities: no Pedal  edema, no calf tenderness  Data Reviewed: CBC:  Recent Labs Lab 11/26/16 2142 11/27/16 0328 11/27/16 0620 11/28/16 0336 11/29/16 0425  WBC 14.5* 20.9* 16.0* 11.1* 11.5*  NEUTROABS  --   --  14.7* 9.5* 10.5*  HGB 14.0 15.2* 15.0 13.1 11.5*  HCT 42.0 45.9 46.6* 40.0 34.5*  MCV 83.2 83.5 83.5 82.8 80.4  PLT 304 281 265 213 947   Basic Metabolic Panel:  Recent Labs Lab 11/26/16 2142 11/27/16 0328 11/28/16 0336 11/29/16 0425  NA 136 136 139 136  K 4.0 4.1 4.3 3.9  CL 101 103 109 106  CO2 24 19* 17* 20*  GLUCOSE 136* 167* 96 86  BUN 22* 23* 31* 20  CREATININE 1.24* 1.18* 1.29* 1.12*  CALCIUM 9.9 9.4 8.0* 7.6*  MG  --   --  1.6* 1.8    Liver Function Tests:  Recent Labs Lab 11/26/16 2142 11/28/16 0336 11/29/16 0425  AST 21 21 19   ALT 14 14 13*  ALKPHOS 74 35* 49  BILITOT 0.6 0.7 0.3  PROT 7.0 4.6* 4.5*  ALBUMIN 4.0 2.3* 2.1*    Recent Labs Lab 11/26/16 2142  LIPASE 23    Studies: No results found.   Scheduled Meds: . amitriptyline  25 mg Oral QHS  . aspirin EC  81 mg Oral Daily  . cefTRIAXone (ROCEPHIN)  IV  1 g Intravenous Q24H  . enoxaparin (LOVENOX) injection  40 mg Subcutaneous Q24H  . famotidine  20 mg Oral BID  . metroNIDAZOLE  500 mg Oral Q8H  . nicotine  21 mg Transdermal Daily  . polyethylene glycol  17 g Oral BID  . psyllium  1 packet Oral Daily  . sodium chloride flush  3 mL Intravenous Q12H   Continuous Infusions: . sodium chloride 100 mL/hr at 11/28/16 0133   PRN Meds: acetaminophen **OR** acetaminophen, docusate sodium, hydrALAZINE, methocarbamol, morphine injection, ondansetron (ZOFRAN) IV, senna, zolpidem  Time spent: 30 minutes  Author: Berle Mull, MD [facility] Pager: [phone] 11/29/2016 4:01 PM  If 7PM-7AM, please contact night-coverage at [email or web address], password [email or web address]

## 2016-11-30 ENCOUNTER — Inpatient Hospital Stay (HOSPITAL_COMMUNITY): Payer: Medicare HMO

## 2016-11-30 LAB — CBC WITH DIFFERENTIAL/PLATELET
Basophils Absolute: 0 10*3/uL (ref 0.0–0.1)
Basophils Relative: 0 %
EOS ABS: 0 10*3/uL (ref 0.0–0.7)
Eosinophils Relative: 0 %
HEMATOCRIT: 34.2 % — AB (ref 36.0–46.0)
HEMOGLOBIN: 11.3 g/dL — AB (ref 12.0–15.0)
LYMPHS ABS: 1 10*3/uL (ref 0.7–4.0)
Lymphocytes Relative: 7 %
MCH: 26.3 pg (ref 26.0–34.0)
MCHC: 33 g/dL (ref 30.0–36.0)
MCV: 79.7 fL (ref 78.0–100.0)
MONOS PCT: 5 %
Monocytes Absolute: 0.8 10*3/uL (ref 0.1–1.0)
NEUTROS PCT: 88 %
Neutro Abs: 13 10*3/uL — ABNORMAL HIGH (ref 1.7–7.7)
Platelets: 207 10*3/uL (ref 150–400)
RBC: 4.29 MIL/uL (ref 3.87–5.11)
RDW: 13.6 % (ref 11.5–15.5)
WBC: 14.8 10*3/uL — ABNORMAL HIGH (ref 4.0–10.5)

## 2016-11-30 LAB — COMPREHENSIVE METABOLIC PANEL
ALBUMIN: 2.1 g/dL — AB (ref 3.5–5.0)
ALT: 15 U/L (ref 14–54)
AST: 23 U/L (ref 15–41)
Alkaline Phosphatase: 69 U/L (ref 38–126)
Anion gap: 6 (ref 5–15)
BUN: 11 mg/dL (ref 6–20)
CHLORIDE: 111 mmol/L (ref 101–111)
CO2: 22 mmol/L (ref 22–32)
CREATININE: 0.96 mg/dL (ref 0.44–1.00)
Calcium: 7.6 mg/dL — ABNORMAL LOW (ref 8.9–10.3)
GFR calc non Af Amer: 56 mL/min — ABNORMAL LOW (ref >60)
Glucose, Bld: 95 mg/dL (ref 65–99)
Potassium: 3.3 mmol/L — ABNORMAL LOW (ref 3.5–5.1)
SODIUM: 139 mmol/L (ref 135–145)
Total Bilirubin: 0.8 mg/dL (ref 0.3–1.2)
Total Protein: 4.8 g/dL — ABNORMAL LOW (ref 6.5–8.1)

## 2016-11-30 LAB — MAGNESIUM: MAGNESIUM: 1.8 mg/dL (ref 1.7–2.4)

## 2016-11-30 MED ORDER — POTASSIUM CHLORIDE CRYS ER 20 MEQ PO TBCR
40.0000 meq | EXTENDED_RELEASE_TABLET | ORAL | Status: AC
  Administered 2016-11-30 (×2): 40 meq via ORAL
  Filled 2016-11-30 (×2): qty 2

## 2016-11-30 MED ORDER — METOCLOPRAMIDE HCL 5 MG/ML IJ SOLN
5.0000 mg | Freq: Two times a day (BID) | INTRAMUSCULAR | Status: DC
  Administered 2016-11-30 – 2016-12-01 (×2): 5 mg via INTRAVENOUS
  Filled 2016-11-30 (×2): qty 2

## 2016-11-30 MED ORDER — METOCLOPRAMIDE HCL 5 MG/ML IJ SOLN
10.0000 mg | Freq: Once | INTRAMUSCULAR | Status: AC
  Administered 2016-11-30: 10 mg via INTRAVENOUS
  Filled 2016-11-30: qty 2

## 2016-11-30 NOTE — Progress Notes (Signed)
[facility] Progress Note  Patient: Michele Lewis [id number]:[id number]   PCP: Antony Blackbird, MD DOB: 12-28-1940   DOA: 11/26/2016   DOS: 11/30/2016   Date of Service: the patient was seen and examined on 11/30/2016   Subjective: Abdominal pain is only intermittent now. Not feeling like eating. Continues to have bowel movements yesterday but not passing gas this morning.  Brief hospital course: Pt. with PMH of hypertension, active smoker; admitted on 11/26/2016, with complaint of nausea vomiting and diarrhea with abdominal pain, was found to have enteritis with severe constipation. Currently further plan is continue monitoring of response to treatment supportive management.  Assessment and Plan: 1. Enteritis with ileus. Constipation. Presented with nausea vomiting and diarrhea. Has seen PCP as an outpatient was given symptomatic treatment for that. Did not have any improvement and having severe abdominal pain. CT of the abdomen was showing constipation without any evidence of obstruction. Constipation resolved with Fleet's enema and have some diarrhea. X-ray of the abdomen shows multiple air-fluid levels although no definite obstruction identified. Possible enteritis suggested. Has leukocytosis on admission which is getting better but has increased bandemia. Given this pt was started on ceftriaxone and Flagyl. Seems doing better with improvement in pain and bandemia. Will finish a 7 day treatment course. Changed to soft diet, add Reglan Continue MiraLAX and when necessary stool softeners, add metamucil ESR and CRP significantly elevated, case briefly discussed with GI and general surgery. Recommend outpatient follow-up with GI.  2. Essential hypertension. Currently holding oral hypertensive medications.  3. GERD. Patient was on IV Pepcid, I would switch to oral Pepcid.  4. Hypomagnesemia. Monitor  5. Superficial abdominal pain. Along with colicky pain the patient also has  superficial continuous abdominal pain. Amitriptyline started and the patient is feeling improvement. Add Robaxin.  6. Mild renal dysfunction. Hypokalemia Continue IV fluids. Replacing  Bowel regimen: last BM 11/28/2016 Diet: full liquid diet DVT Prophylaxis: subcutaneous Heparin  Advance goals of care discussion: Full code  Family Communication: family was present at bedside, at the time of interview.   Disposition:  Discharge to home Expected discharge date: 12/01/2016. Pending diet tolerance  Consultants: none Procedures: none  Antibiotics: Anti-infectives    Start     Dose/Rate Route Frequency Ordered Stop   11/29/16 1400  metroNIDAZOLE (FLAGYL) tablet 500 mg     500 mg Oral Every 8 hours 11/29/16 1331     11/28/16 1400  cefTRIAXone (ROCEPHIN) 1 g in dextrose 5 % 50 mL IVPB     1 g 100 mL/hr over 30 Minutes Intravenous Every 24 hours 11/28/16 1240     11/28/16 1300  metroNIDAZOLE (FLAGYL) IVPB 500 mg  Status:  Discontinued     500 mg 100 mL/hr over 60 Minutes Intravenous Every 8 hours 11/28/16 1236 11/29/16 1331      Objective: Physical Exam: Vitals:   11/29/16 2046 11/30/16 0552 11/30/16 0736 11/30/16 1800  BP: (!) 148/66 129/85 (!) 141/68   Pulse: 95 82 88   Resp: _0 Temp: 98.3 F (36.8 C) 98.2 F (36.8 C) 98.6 F (37 C) 99 F (37.2 C)  TempSrc: Oral Oral Oral   SpO2: 94% 95% 92%   Weight:      Height:        Intake/Output Summary (Last 24 hours) at 11/30/16 1822 Last data filed at 11/30/16 1300  Gross per 24 hour  Intake              480 ml  Output                0 ml  Net              480 ml   Filed Weights   11/27/16 0415  Weight: 62 kg (136 lb 11 oz)    General: Alert, Awake and Oriented to Time, Place and Person. Appear in mild distress, affect appropriate ENT: Oral Mucosa clear moist. Cardiovascular: S1 and S2 Present, no Murmur, Respiratory: Bilateral Air entry equal and Decreased, no use of accessory muscle, Clear to  Auscultation, no Crackles, no wheezes Abdomen: Bowel Sound present,sluggish, Soft and diffuse tenderness Extremities: no Pedal edema, no calf tenderness  Data Reviewed: CBC:  Recent Labs Lab 11/27/16 0328 11/27/16 0620 11/28/16 0336 11/29/16 0425 11/30/16 0312  WBC 20.9* 16.0* 11.1* 11.5* 14.8*  NEUTROABS  --  14.7* 9.5* 10.5* 13.0*  HGB 15.2* 15.0 13.1 11.5* 11.3*  HCT 45.9 46.6* 40.0 34.5* 34.2*  MCV 83.5 83.5 82.8 80.4 79.7  PLT 281 265 213 204 027   Basic Metabolic Panel:  Recent Labs Lab 11/26/16 2142 11/27/16 0328 11/28/16 0336 11/29/16 0425 11/30/16 0312  NA 136 136 139 136 139  K 4.0 4.1 4.3 3.9 3.3*  CL 101 103 109 106 111  CO2 24 19* 17* 20* 22  GLUCOSE 136* 167* 96 86 95  BUN 22* 23* 31* 20 11  CREATININE 1.24* 1.18* 1.29* 1.12* 0.96  CALCIUM 9.9 9.4 8.0* 7.6* 7.6*  MG  --   --  1.6* 1.8 1.8    Liver Function Tests:  Recent Labs Lab 11/26/16 2142 11/28/16 0336 11/29/16 0425 11/30/16 0312  AST _0 ALT 14 14 13* 15  ALKPHOS 74 35* 49 69  BILITOT 0.6 0.7 0.3 0.8  PROT 7.0 4.6* 4.5* 4.8*  ALBUMIN 4.0 2.3* 2.1* 2.1*    Recent Labs Lab 11/26/16 2142  LIPASE 23    Studies: Dg Abd 2 Views  Result Date: 11/30/2016 CLINICAL DATA:  Enteritis.  Abdominal pain and diarrhea.  Ileus. EXAM: ABDOMEN - 2 VIEW COMPARISON:  11/28/2016 abdominal radiographs. FINDINGS: No disproportionately dilated small bowel loops or significant air-fluid levels. Diffuse mild gaseous distention of the large bowel, not appreciably changed. No evidence of pneumatosis or pneumoperitoneum. Trace bilateral pleural effusions and bibasilar lung opacities appear new. No pathologic soft tissue calcifications. IMPRESSION: 1. Nonobstructive bowel gas pattern. 2. Stable diffuse mild gaseous distention of the large bowel, compatible with adynamic colonic ileus. No free air. 3. New trace bilateral pleural effusions and bibasilar lung opacities, probably representing atelectasis,  with a component of pneumonia or aspiration not excluded. Electronically Signed   By: Ilona Sorrel M.D.   On: 11/30/2016 08:42     Scheduled Meds: . amitriptyline  25 mg Oral QHS  . aspirin EC  81 mg Oral Daily  . cefTRIAXone (ROCEPHIN)  IV  1 g Intravenous Q24H  . enoxaparin (LOVENOX) injection  40 mg Subcutaneous Q24H  . famotidine  20 mg Oral BID  . metroNIDAZOLE  500 mg Oral Q8H  . nicotine  21 mg Transdermal Daily  . psyllium  1 packet Oral Daily  . sodium chloride flush  3 mL Intravenous Q12H   Continuous Infusions: . sodium chloride 100 mL/hr at 11/28/16 0133   PRN Meds: acetaminophen **OR** acetaminophen, hydrALAZINE, methocarbamol, morphine injection, ondansetron (ZOFRAN) IV, zolpidem  Time spent: 30 minutes  Author: Berle Mull, MD [facility] Pager: [phone] 11/30/2016 6:22 PM  If 7PM-7AM, please  contact night-coverage at [email or web address], password [facility]

## 2016-12-01 LAB — CBC WITH DIFFERENTIAL/PLATELET
BASOS PCT: 0 %
Basophils Absolute: 0 10*3/uL (ref 0.0–0.1)
Eosinophils Absolute: 0.1 10*3/uL (ref 0.0–0.7)
Eosinophils Relative: 1 %
HEMATOCRIT: 32.2 % — AB (ref 36.0–46.0)
Hemoglobin: 10.9 g/dL — ABNORMAL LOW (ref 12.0–15.0)
Lymphocytes Relative: 9 %
Lymphs Abs: 1.2 10*3/uL (ref 0.7–4.0)
MCH: 26.6 pg (ref 26.0–34.0)
MCHC: 33.9 g/dL (ref 30.0–36.0)
MCV: 78.5 fL (ref 78.0–100.0)
MONO ABS: 1 10*3/uL (ref 0.1–1.0)
MONOS PCT: 8 %
NEUTROS ABS: 10.5 10*3/uL — AB (ref 1.7–7.7)
Neutrophils Relative %: 82 %
Platelets: 230 10*3/uL (ref 150–400)
RBC: 4.1 MIL/uL (ref 3.87–5.11)
RDW: 13.5 % (ref 11.5–15.5)
WBC: 12.8 10*3/uL — ABNORMAL HIGH (ref 4.0–10.5)

## 2016-12-01 LAB — BASIC METABOLIC PANEL
ANION GAP: 7 (ref 5–15)
BUN: 7 mg/dL (ref 6–20)
CALCIUM: 7.4 mg/dL — AB (ref 8.9–10.3)
CO2: 20 mmol/L — AB (ref 22–32)
CREATININE: 0.86 mg/dL (ref 0.44–1.00)
Chloride: 111 mmol/L (ref 101–111)
GFR calc Af Amer: >60 mL/min (ref >60)
GFR calc non Af Amer: >60 mL/min (ref >60)
GLUCOSE: 93 mg/dL (ref 65–99)
Potassium: 3.5 mmol/L (ref 3.5–5.1)
Sodium: 138 mmol/L (ref 135–145)

## 2016-12-01 LAB — MAGNESIUM: Magnesium: 1.7 mg/dL (ref 1.7–2.4)

## 2016-12-01 MED ORDER — AMITRIPTYLINE HCL 25 MG PO TABS: 25.0000 mg | ORAL_TABLET | Freq: Every day | ORAL | 0 refills | Status: AC

## 2016-12-01 MED ORDER — METOCLOPRAMIDE HCL 5 MG PO TABS: 5.0000 mg | ORAL_TABLET | Freq: Four times a day (QID) | ORAL | 0 refills | Status: AC | PRN

## 2016-12-01 MED ORDER — SALINE SPRAY 0.65 % NA SOLN: 1.0000 | NASAL | 0 refills | Status: AC | PRN

## 2016-12-01 MED ORDER — SACCHAROMYCES BOULARDII 250 MG PO CAPS: 250.0000 mg | ORAL_CAPSULE | Freq: Two times a day (BID) | ORAL | 0 refills | Status: AC

## 2016-12-01 MED ORDER — NICOTINE 21 MG/24HR TD PT24
21.0000 mg | MEDICATED_PATCH | Freq: Every day | TRANSDERMAL | 0 refills | Status: AC
Start: 2016-12-01 — End: ?

## 2016-12-01 MED ORDER — CEPHALEXIN 500 MG PO CAPS: 500.0000 mg | ORAL_CAPSULE | Freq: Two times a day (BID) | ORAL | 0 refills | Status: AC

## 2016-12-01 MED ORDER — FAMOTIDINE 20 MG PO TABS
20.0000 mg | ORAL_TABLET | Freq: Two times a day (BID) | ORAL | 0 refills | Status: AC
Start: 2016-12-01 — End: ?

## 2016-12-01 MED ORDER — METRONIDAZOLE 500 MG PO TABS: 500.0000 mg | ORAL_TABLET | Freq: Three times a day (TID) | ORAL | 0 refills | Status: AC

## 2016-12-01 MED ORDER — METHOCARBAMOL 500 MG PO TABS: 500.0000 mg | ORAL_TABLET | Freq: Three times a day (TID) | ORAL | 0 refills | Status: AC | PRN

## 2016-12-01 MED ORDER — TRAMADOL HCL 50 MG PO TABS: 50.0000 mg | ORAL_TABLET | Freq: Three times a day (TID) | ORAL | 0 refills | Status: AC | PRN

## 2016-12-01 NOTE — Discharge Instructions (Signed)
Food Choices to Help Relieve Diarrhea, Adult When you have diarrhea, the foods you eat and your eating habits are very important. Choosing the right foods and drinks can help relieve diarrhea. Also, because diarrhea can last up to 7 days, you need to replace lost fluids and electrolytes (such as sodium, potassium, and chloride) in order to help prevent dehydration. What general guidelines do I need to follow?  Slowly drink 1 cup (8 oz) of fluid for each episode of diarrhea. If you are getting enough fluid, your urine will be clear or pale yellow.  Eat starchy foods. Some good choices include white rice, white toast, pasta, low-fiber cereal, baked potatoes (without the skin), saltine crackers, and bagels.  Avoid large servings of any cooked vegetables.  Limit fruit to two servings per day. A serving is  cup or 1 small piece.  Choose foods with less than 2 g of fiber per serving.  Limit fats to less than 8 tsp (38 g) per day.  Avoid fried foods.  Eat foods that have probiotics in them. Probiotics can be found in certain dairy products.  Avoid foods and beverages that may increase the speed at which food moves through the stomach and intestines (gastrointestinal tract). Things to avoid include:  High-fiber foods, such as dried fruit, raw fruits and vegetables, nuts, seeds, and whole grain foods.  Spicy foods and high-fat foods.  Foods and beverages sweetened with high-fructose corn syrup, honey, or sugar alcohols such as xylitol, sorbitol, and mannitol. What foods are recommended? Grains  White rice. White, [place], or pita breads (fresh or toasted), including plain rolls, buns, or bagels. White pasta. Saltine, soda, or graham crackers. Pretzels. Low-fiber cereal. Cooked cereals made with water (such as cornmeal, farina, or cream cereals). Plain muffins. Matzo. Melba toast. Zwieback. Vegetables  Potatoes (without the skin). Strained tomato and vegetable juices. Most well-cooked and canned  vegetables without seeds. Tender lettuce. Fruits  Cooked or canned applesauce, apricots, cherries, fruit cocktail, grapefruit, peaches, pears, or plums. Fresh bananas, apples without skin, cherries, grapes, cantaloupe, grapefruit, peaches, oranges, or plums. Meat and Other Protein Products  Baked or boiled chicken. Eggs. Tofu. Fish. Seafood. Smooth peanut butter. Ground or well-cooked tender beef, ham, veal, lamb, pork, or poultry. Dairy  Plain yogurt, kefir, and unsweetened liquid yogurt. Lactose-free milk, buttermilk, or soy milk. Plain hard cheese. Beverages  Sport drinks. Clear broths. Diluted fruit juices (except prune). Regular, caffeine-free sodas such as ginger ale. Water. Decaffeinated teas. Oral rehydration solutions. Sugar-free beverages not sweetened with sugar alcohols. Other  Bouillon, broth, or soups made from recommended foods. The items listed above may not be a complete list of recommended foods or beverages. Contact your dietitian for more options.  What foods are not recommended? Grains  Whole grain, whole wheat, bran, or rye breads, rolls, pastas, crackers, and cereals. Wild or brown rice. Cereals that contain more than 2 g of fiber per serving. Corn tortillas or taco shells. Cooked or dry oatmeal. Granola. Popcorn. Vegetables  Raw vegetables. Cabbage, broccoli, Brussels sprouts, artichokes, baked beans, beet greens, corn, kale, legumes, peas, sweet potatoes, and yams. Potato skins. Cooked spinach and cabbage. Fruits  Dried fruit, including raisins and dates. Raw fruits. Stewed or dried prunes. Fresh apples with skin, apricots, mangoes, pears, raspberries, and strawberries. Meat and Other Protein Products  Chunky peanut butter. Nuts and seeds. Beans and lentils. Berniece Salines. Dairy  High-fat cheeses. Milk, chocolate milk, and beverages made with milk, such as milk shakes. Cream. Ice cream. Sweets and Desserts  Sweet rolls, doughnuts, and sweet breads. Pancakes and waffles. Fats  and Oils  Butter. Cream sauces. Margarine. Salad oils. Plain salad dressings. Olives. Avocados. Beverages  Caffeinated beverages (such as coffee, tea, soda, or energy drinks). Alcoholic beverages. Fruit juices with pulp. Prune juice. Soft drinks sweetened with high-fructose corn syrup or sugar alcohols. Other  Coconut. Hot sauce. Chili powder. Mayonnaise. Gravy. Cream-based or milk-based soups. The items listed above may not be a complete list of foods and beverages to avoid. Contact your dietitian for more information.  What should I do if I become dehydrated? Diarrhea can sometimes lead to dehydration. Signs of dehydration include dark urine and dry mouth and skin. If you think you are dehydrated, you should rehydrate with an oral rehydration solution. These solutions can be purchased at pharmacies, retail stores, or online. Drink -1 cup (120-240 mL) of oral rehydration solution each time you have an episode of diarrhea. If drinking this amount makes your diarrhea worse, try drinking smaller amounts more often. For example, drink 1-3 tsp (5-15 mL) every 5-10 minutes. A general rule for staying hydrated is to drink 1-2 L of fluid per day. Talk to your health care provider about the specific amount you should be drinking each day. Drink enough fluids to keep your urine clear or pale yellow. This information is not intended to replace advice given to you by your health care provider. Make sure you discuss any questions you have with your health care provider. Document Released: 12/23/2003 Document Revised: 03/09/2016 Document Reviewed: 08/25/2013 Elsevier Interactive Patient Education  2017 [place] Meal Plan A soft-food meal plan includes foods that are safe and easy to swallow. This meal plan typically is used:  If you are having trouble chewing or swallowing foods.  As a transition meal plan after only having had liquid meals for a long period. What do I need to know about  the soft-food meal plan? A soft-food meal plan includes tender foods that are soft and easy to chew and swallow. In most cases, bite-sized pieces of food are easier to swallow. A bite-sized piece is about  inch or smaller. Foods in this plan do not need to be ground or pureed. Foods that are very hard, crunchy, or sticky should be avoided. Also, breads, cereals, yogurts, and desserts with nuts, seeds, or fruits should be avoided. What foods can I eat? Grains  Rice and wild rice. Moist bread, dressing, pasta, and noodles. Well-moistened dry or cooked cereals, such as farina (cooked wheat cereal), oatmeal, or grits. Biscuits, breads, muffins, pancakes, and waffles that have been well moistened. Vegetables  Shredded lettuce. Cooked, tender vegetables, including potatoes without skins. Vegetable juices. Broths or creamed soups made with vegetables that are not stringy or chewy. Strained tomatoes (without seeds). Fruits  Canned or well-cooked fruits. Soft (ripe), peeled fresh fruits, such as peaches, nectarines, kiwi, cantaloupe, honeydew melon, and watermelon (without seeds). Soft berries with small seeds, such as strawberries. Fruit juices (without pulp). Meats and Other Protein Sources  Moist, tender, lean beef. Mutton. Lamb. Veal. Chicken. [place]. Liver. Ham. Fish without bones. Eggs. Dairy  Milk, milk drinks, and cream. Plain cream cheese and cottage cheese. Plain yogurt. Sweets/Desserts  Flavored gelatin desserts. Custard. Plain ice cream, frozen yogurt, sherbet, milk shakes, and malts. Plain cakes and cookies. Plain hard candy. Other  Butter, margarine (without trans fat), and cooking oils. Mayonnaise. Cream sauces. Mild spices, salt, and sugar. Syrup, molasses, honey, and jelly. The items listed above may not be  a complete list of recommended foods or beverages. Contact your dietitian for more options.  What foods are not recommended? Grains  Dry bread, toast, crackers that have not been  moistened. Coarse or dry cereals, such as bran, granola, and shredded wheat. Tough or chewy crusty breads, such as [place] bread or baguettes. Vegetables  Corn. Raw vegetables except shredded lettuce. Cooked vegetables that are tough or stringy. Tough, crisp, fried potatoes and potato skins. Fruits  Fresh fruits with skins or seeds or both, such as apples, pears, or grapes. Stringy, high-pulp fruits, such as papaya, pineapple, coconut, or mango. Fruit leather, fruit roll-ups, and all dried fruits. Meats and Other Protein Sources  Sausages and hot dogs. Meats with gristle. Fish with bones. Nuts, seeds, and chunky peanut or other nut butters. Sweets/Desserts  Cakes or cookies that are very dry or chewy. The items listed above may not be a complete list of foods and beverages to avoid. Contact your dietitian for more information.  This information is not intended to replace advice given to you by your health care provider. Make sure you discuss any questions you have with your health care provider. Document Released: 01/09/2008 Document Revised: 03/09/2016 Document Reviewed: 08/29/2013 Elsevier Interactive Patient Education  2017 [employer].

## 2016-12-01 NOTE — Progress Notes (Signed)
CPM applied.

## 2016-12-01 NOTE — Care Management Important Message (Signed)
Important Message  Patient Details  Name: Michele Lewis MRN: [id number] Date of Birth: 1941-02-02   Medicare Important Message Given:  Yes    Jordyan Hardiman 12/01/2016, 2:00 PM

## 2016-12-01 NOTE — Progress Notes (Signed)
Patient ambulated on room air and oxygen saturation remained at 92-96% throughout the walk. No difficulty with breathing and no shortness of breath.

## 2016-12-01 NOTE — Progress Notes (Signed)
Discharge instructions reviewed with pt and d/c rx as well. All personal belongings with pt. Pt demonstrates no s/sx of distress.

## 2016-12-05 DIAGNOSIS — R109 Unspecified abdominal pain: Secondary | ICD-10-CM | POA: Diagnosis not present

## 2016-12-05 NOTE — Discharge Summary (Signed)
[facility] Discharge Summary   Patient: Michele Lewis LNL:[id number]   PCP: Cammy Copa, MD DOB: Sep 06, 1941   Date of admission: 11/26/2016   Date of discharge: 12/01/2016     Discharge Diagnoses:  Principal Problem:   Enteritis Active Problems:   Gastroesophageal reflux disease without esophagitis   Essential hypertension   Tobacco abuse   Back pain   Abdominal pain   Nausea vomiting and diarrhea   Acute renal failure superimposed on stage 3 chronic kidney disease ([place])   Admitted From: home Disposition:  home  Recommendations for Outpatient Follow-up:  1. Follow-up with PCP in one week, get BMP ESR and CRP checked. ESR 41, CRP 30 in the hospital.  2. Follow-up with GI in a month.  Follow-up Information    Cammy Copa, MD. Schedule an appointment as soon as possible for a visit in 1 week(s).   Specialty:  Family Medicine Why:  check BMP, ESR, CRP. Contact information: [address] [place] [address] [phone]        Cassell Clement, MD. Schedule an appointment as soon as possible for a visit in 2 week(s).   Specialty:  Gastroenterology Why:  enteritis. Contact information: [address] [place] [address] [phone]          Diet recommendation: Soft diet  Activity: The patient is advised to gradually reintroduce usual activities.  Discharge Condition: good  Code Status: full code  History of present illness: As per the H and P dictated on admission, "Michele Lewis is a 76 y.o. female with medical history significant of hypertension, GERD, depression, back pain, tobacco abuse, CKD-3, who presents with nausea, vomiting, diarrhea and abdominal pain.  Patient states that she started having nausea, vomiting, diarrhea last Saturday. She was seen by her PCP on Wednesday, and was given antinausea and diarrhea medications. Her diarrhea has resolved on Tuesday, but she continues to have nausea and  vomiting. Mostly dry heaves, but she had one big vomiting today. Her last bowel movement was on Tuesday. She does not have runny nose, sore throat or body aches. Patient states that she started having abdominal pain today. It is located in the lower abdomen, constant, sharp, 10 out of 10 in severity, nonradiating. Patient does not have fever or chills. No hematemesis or hematuria or hematochezia. Patient does not have chest pain, shortness rest, cough, unilateral weakness."  Hospital Course:   Summary of her active problems in the hospital is as following. 1. Enteritis with ileus. Constipation. CRP 30.7, ESR 41 Presented with nausea vomiting and diarrhea. CT of the abdomen was showing constipation without any evidence of obstruction. Constipation resolved with Fleet's enema and have some diarrhea. X-ray of the abdomen shows multiple air-fluid levels although no definite obstruction identified. Possible enteritis suggested. Has leukocytosis on admission which is getting better but has increased bandemia. Given this pt was started on ceftriaxone and Flagyl. Seems doing better with improvement in pain and bandemia. Continue Keflex and Flagyl on discharge. Also continue soft diet, add Reglan Continue when necessary stool softeners. Case briefly discussed with GI and general surgery. Recommend outpatient follow-up with GI.  2. Essential hypertension. Resume home blood pressure medication.  3. GERD. Continue on discharge  4. Hypomagnesemia. Replaced   5. Superficial abdominal pain. Along with colicky pain the patient also has superficial continuous abdominal pain. Amitriptyline started and the patient is feeling improvement. Add Robaxin.  6. Mild renal dysfunction. Hypokalemia Resolved   All  other chronic medical condition were stable during the hospitalization.  Patient was ambulatory without any assistance. On the day of the discharge the patient's vitals were stable, and no  other acute medical condition were reported by patient. the patient was felt safe to be discharge at home with family.  Procedures and Results:  none   Consultations:  None phone consultation with GI and general surgery  DISCHARGE MEDICATION: Discharge Medication List as of 12/01/2016 11:40 AM    START taking these medications   Details  amitriptyline (ELAVIL) 25 MG tablet Take 1 tablet (25 mg total) by mouth at bedtime., Starting Fri 12/01/2016, Normal    cephALEXin (KEFLEX) 500 MG capsule Take 1 capsule (500 mg total) by mouth 2 (two) times daily., Starting Fri 12/01/2016, Until Fri 12/08/2016, Normal    famotidine (PEPCID) 20 MG tablet Take 1 tablet (20 mg total) by mouth 2 (two) times daily., Starting Fri 12/01/2016, Normal    methocarbamol (ROBAXIN) 500 MG tablet Take 1 tablet (500 mg total) by mouth every 8 (eight) hours as needed for muscle spasms., Starting Fri 12/01/2016, Normal    metroNIDAZOLE (FLAGYL) 500 MG tablet Take 1 tablet (500 mg total) by mouth every 8 (eight) hours., Starting Fri 12/01/2016, Until Fri 12/08/2016, Normal    nicotine (NICODERM CQ - DOSED IN MG/24 HOURS) 21 mg/24hr patch Place 1 patch (21 mg total) onto the skin daily., Starting Fri 12/01/2016, Normal    saccharomyces boulardii (FLORASTOR) 250 MG capsule Take 1 capsule (250 mg total) by mouth 2 (two) times daily., Starting Fri 12/01/2016, Normal    traMADol (ULTRAM) 50 MG tablet Take 1 tablet (50 mg total) by mouth every 8 (eight) hours as needed for moderate pain or severe pain., Starting Fri 12/01/2016, Print      CONTINUE these medications which have NOT CHANGED   Details  aspirin EC 81 MG tablet Take 81 mg by mouth daily., Until Discontinued, Historical Med    valsartan (DIOVAN) 160 MG tablet Take 160 mg by mouth daily., Starting Sun 10/15/2016, Historical Med      STOP taking these medications     lansoprazole (PREVACID) 30 MG capsule      naproxen sodium (ALEVE) 220 MG tablet      ondansetron  (ZOFRAN) 8 MG tablet        Allergies  Allergen Reactions  . Sulfa Antibiotics Other (See Comments)    Get sicker with whatever illness is that it's supposed to treat   Discharge Instructions    DIET SOFT    Complete by:  As directed    Discharge instructions    Complete by:  As directed    It is important that you read following instructions as well as go over your medication list with RN to help you understand your care after this hospitalization.  Discharge Instructions: Please follow-up with PCP in one week, gastroenterology in 2-3 weeks  Please request your primary care physician to go over all Hospital Tests and Procedure/Radiological results at the follow up,  Please get all Hospital records sent to your PCP by signing hospital release before you go home.   Do not drive, operating heavy machinery, perform activities at heights, swimming or participation in water activities or provide baby sitting services while your are on Pain, Sleep and Anxiety Medications; until you have been seen by Primary Care Physician or a Neurologist and advised to do so again. Do not take more than prescribed Pain, Sleep and Anxiety Medications. You were cared for  by a hospitalist during your hospital stay. If you have any questions about your discharge medications or the care you received while you were in the hospital after you are discharged, you can call the unit and ask to speak with the hospitalist on call if the hospitalist that took care of you is not available.  Once you are discharged, your primary care physician will handle any further medical issues. Please note that NO REFILLS for any discharge medications will be authorized once you are discharged, as it is imperative that you return to your primary care physician (or establish a relationship with a primary care physician if you do not have one) for your aftercare needs so that they can reassess your need for medications and monitor your lab  values. You Must read complete instructions/literature along with all the possible adverse reactions/side effects for all the Medicines you take and that have been prescribed to you. Take any new Medicines after you have completely understood and accept all the possible adverse reactions/side effects. Wear Seat belts while driving. If you have smoked or chewed Tobacco in the last 2 yrs please stop smoking and/or stop any Recreational drug use.   Increase activity slowly    Complete by:  As directed      Discharge Exam: Filed Weights   11/27/16 0415  Weight: 62 kg (136 lb 11 oz)   Vitals:   11/30/16 2100 12/01/16 0603  BP: (!) 123/93 (!) 169/71  Pulse: 96 98  Resp: 16 16  Temp: 99.3 F (37.4 C) 98.9 F (37.2 C)   General: Appear in no distress, no Rash; Oral Mucosa moist. Cardiovascular: S1 and S2 Present, no Murmur, no JVD Respiratory: Bilateral Air entry present and Clear to Auscultation, no Crackles, no wheezes Abdomen: Bowel Sound present, Soft and mild tenderness Extremities: no Pedal edema, no calf tenderness Neurology: Grossly no focal neuro deficit.  The results of significant diagnostics from this hospitalization (including imaging, microbiology, ancillary and laboratory) are listed below for reference.    Significant Diagnostic Studies: Ct Abdomen Pelvis W Contrast  Result Date: 11/27/2016 CLINICAL DATA:  Abdominal pain and vomiting. EXAM: CT ABDOMEN AND PELVIS WITH CONTRAST TECHNIQUE: Multidetector CT imaging of the abdomen and pelvis was performed using the standard protocol following bolus administration of intravenous contrast. CONTRAST:  17m ISOVUE-300 IOPAMIDOL (ISOVUE-300) INJECTION 61% COMPARISON:  None. FINDINGS: Lower chest: Bibasilar atelectasis. Small hiatal hernia. Normal size cardiac chambers without pericardial effusion. Hepatobiliary: Slight increase in periportal edema. No space-occupying mass of the liver. Gallbladder is physiologically distended without  calculus. Pancreas: Negative Spleen: Negative Adrenals/Urinary Tract: Normal bilateral adrenal glands. No obstructive uropathy or enhancing renal mass. Unremarkable bladder. Stomach/Bowel: Stomach is moderately distended with fluid. No small bowel dilatation. Normal small bowel rotation. There is fluid-filled distention of large bowel with cecum measuring up to 6.4 cm in caliber extending to the level of the sigmoid colon with there is moderate to large colonic stool burden and diverticulosis of the sigmoid. No focal inflammation. Mild distal tapering the rectosigmoid. Vascular/Lymphatic: Prominent left periuterine vessels. Aortoiliac atherosclerosis without aneurysm. Reproductive: Uterus and adnexa demonstrate no acute appearing abnormalities. Other: No free air nor free fluid. Musculoskeletal: Mild fat containing left inguinal hernia. Degenerative disc disease most marked at L4-5 and L5-S1. Left L5 laminectomy. No acute osseous abnormality. IMPRESSION: Fluid-filled distention of large bowel from cecum to sigmoid colon where there is a moderate to large stool burden noted within the sigmoid contributing to this distention. No focal mass nor annular  constricting lesion is apparent. There is sigmoid diverticulosis without acute inflammation. Slight increase in periportal edema, nonspecific since prior exam. Findings can be seen in hypervolemia, passive hepatic congestion, hepatitis or cholangitis among bold some possibilities. Electronically Signed   By: Ashley Royalty M.D.   On: 11/27/2016 01:11   Dg Abd 2 Views  Result Date: 11/30/2016 CLINICAL DATA:  Enteritis.  Abdominal pain and diarrhea.  Ileus. EXAM: ABDOMEN - 2 VIEW COMPARISON:  11/28/2016 abdominal radiographs. FINDINGS: No disproportionately dilated small bowel loops or significant air-fluid levels. Diffuse mild gaseous distention of the large bowel, not appreciably changed. No evidence of pneumatosis or pneumoperitoneum. Trace bilateral pleural effusions  and bibasilar lung opacities appear new. No pathologic soft tissue calcifications. IMPRESSION: 1. Nonobstructive bowel gas pattern. 2. Stable diffuse mild gaseous distention of the large bowel, compatible with adynamic colonic ileus. No free air. 3. New trace bilateral pleural effusions and bibasilar lung opacities, probably representing atelectasis, with a component of pneumonia or aspiration not excluded. Electronically Signed   By: Ilona Sorrel M.D.   On: 11/30/2016 08:42   Dg Abd Acute W/chest  Result Date: 11/28/2016 CLINICAL DATA:  Lower abdominal pain. EXAM: DG ABDOMEN ACUTE W/ 1V CHEST COMPARISON:  CT scan and x-rays dated 11/27/2016 FINDINGS: Minimal new atelectasis at the left lung base. There is increased air in the nondistended colon with multiple air-fluid levels in the upright radiograph. No dilated small bowel. No free air. IMPRESSION: Increased air in the nondistended colon. The finding is most typical for enteritis with multiple air-fluid levels. Does the patient have bowel sounds? New slight atelectasis at the left lung base. Electronically Signed   By: Lorriane Shire M.D.   On: 11/28/2016 09:36   Dg Abdomen Acute W/chest  Result Date: 11/27/2016 CLINICAL DATA:  Mid abdominal pain since Sunday. Nausea and vomiting. EXAM: DG ABDOMEN ACUTE W/ 1V CHEST COMPARISON:  Chest 07/26/2016.  CT abdomen and pelvis 09/07/2015 FINDINGS: Normal heart size and pulmonary vascularity. Emphysematous changes in the lungs with central interstitial pattern suggesting chronic bronchitis. No focal airspace disease or consolidation. No blunting of costophrenic angles. No pneumothorax. Calcification of the aorta. Gas-filled mildly dilated central mid abdominal small bowel loops. Multiple air-fluid levels demonstrated centrally. Appearance suggests early small bowel obstruction. No free intra- abdominal air. No radiopaque stones. Degenerative changes in the spine and hips. IMPRESSION: Emphysematous changes and  chronic bronchitic changes in the lungs. Gas-filled dilated central mid abdominal small bowel with air-fluid levels suggesting early small bowel obstruction. Electronically Signed   By: Lucienne Capers M.D.   On: 11/27/2016 00:54    Microbiology: No results found for this or any previous visit (from the past 240 hour(s)).   Labs: CBC:  Recent Labs Lab 11/29/16 0425 11/30/16 0312 12/01/16 0520  WBC 11.5* 14.8* 12.8*  NEUTROABS 10.5* 13.0* 10.5*  HGB 11.5* 11.3* 10.9*  HCT 34.5* 34.2* 32.2*  MCV 80.4 79.7 78.5  PLT 204 207 920   Basic Metabolic Panel:  Recent Labs Lab 11/29/16 0425 11/30/16 0312 12/01/16 0520  NA 136 139 138  K 3.9 3.3* 3.5  CL 106 111 111  CO2 20* 22 20*  GLUCOSE 86 95 93  BUN 20 11 7   CREATININE 1.12* 0.96 0.86  CALCIUM 7.6* 7.6* 7.4*  MG 1.8 1.8 1.7   Liver Function Tests:  Recent Labs Lab 11/29/16 0425 11/30/16 0312  AST 19 23  ALT 13* 15  ALKPHOS 49 69  BILITOT 0.3 0.8  PROT 4.5* 4.8*  ALBUMIN  2.1* 2.1*   Time spent: 30 minutes  Signed:  Ric Rosenberg  [facility] 12/01/2016 , 4:32 PM

## 2016-12-11 DIAGNOSIS — R109 Unspecified abdominal pain: Secondary | ICD-10-CM | POA: Diagnosis not present

## 2016-12-26 DIAGNOSIS — K529 Noninfective gastroenteritis and colitis, unspecified: Secondary | ICD-10-CM | POA: Diagnosis not present

## 2017-03-05 DIAGNOSIS — K529 Noninfective gastroenteritis and colitis, unspecified: Secondary | ICD-10-CM | POA: Diagnosis not present

## 2017-03-05 DIAGNOSIS — K219 Gastro-esophageal reflux disease without esophagitis: Secondary | ICD-10-CM | POA: Diagnosis not present

## 2017-05-15 DIAGNOSIS — I10 Essential (primary) hypertension: Secondary | ICD-10-CM | POA: Diagnosis not present

## 2017-05-15 DIAGNOSIS — Z79899 Other long term (current) drug therapy: Secondary | ICD-10-CM | POA: Diagnosis not present

## 2017-05-27 IMAGING — CT CT ABD-PELV W/ CM
2 of 5 series · 15 of 46 positions shown, 17 images · IV contrast (APPLIED)
Comparison: None.

CLINICAL DATA: Nausea abdominal pain for 1 month.

EXAM:
CT ABDOMEN AND PELVIS WITH CONTRAST
TECHNIQUE: Multidetector CT imaging of the abdomen and pelvis was performed
using the standard protocol following bolus administration of
intravenous contrast.
CONTRAST:  100mL 0KMA56-LHH IOPAMIDOL (0KMA56-LHH) INJECTION 61%

[Series 2: abd/pelvis w/cm · axial · 0.68mm/px · z∈[-464,-44]mm · 12 of 94 slices shown, 14 images]
[im 5/94  soft-tissue]
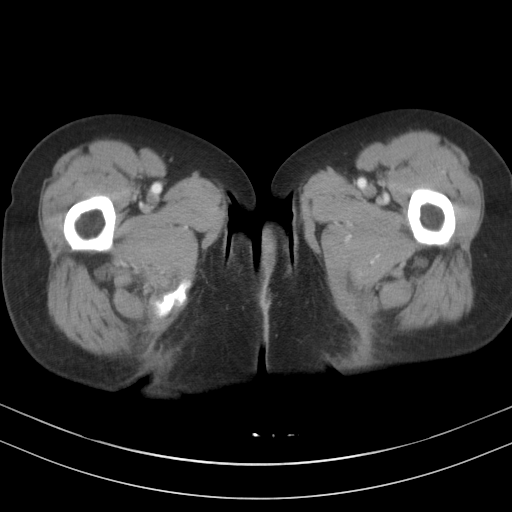
[im 5/94  bone]
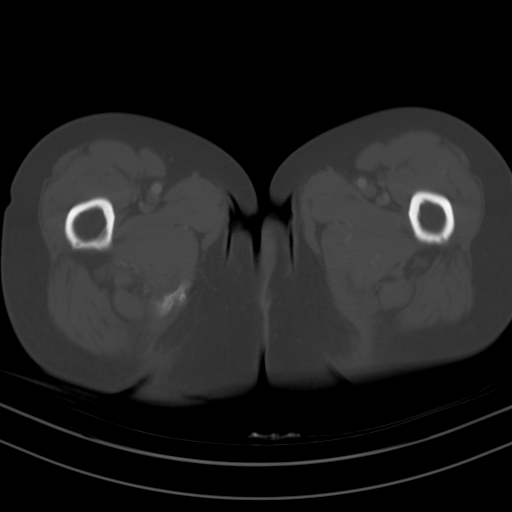
[im 15/94  soft-tissue]
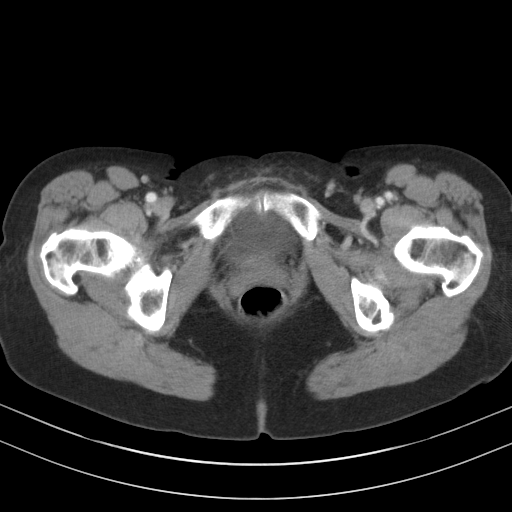
[im 20/94  soft-tissue]
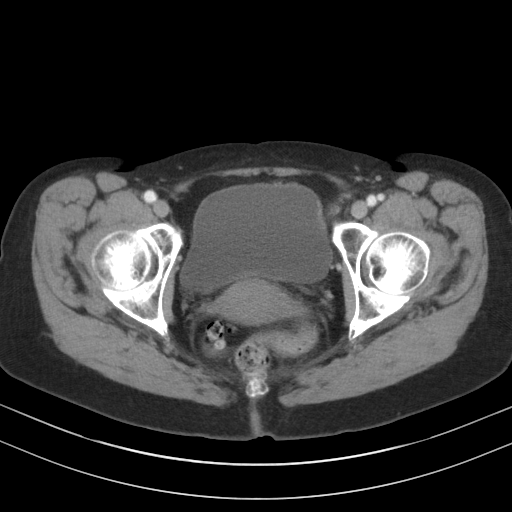
[im 30/94  soft-tissue]
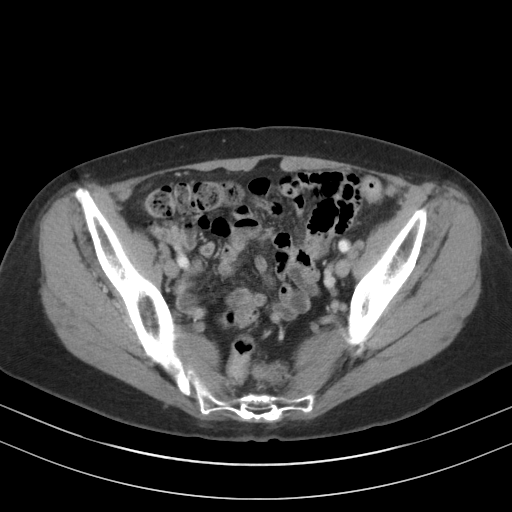
[im 35/94  soft-tissue]
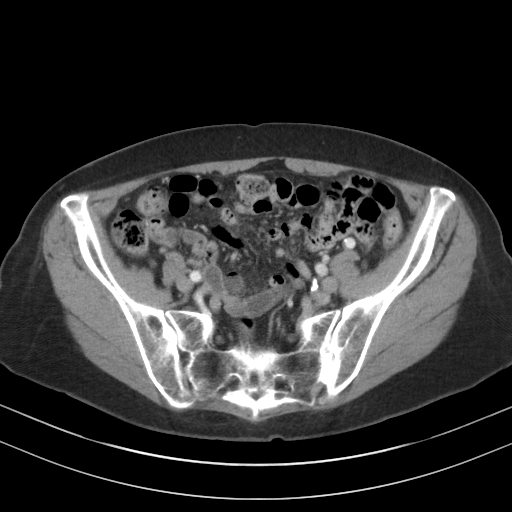
[im 45/94  soft-tissue]
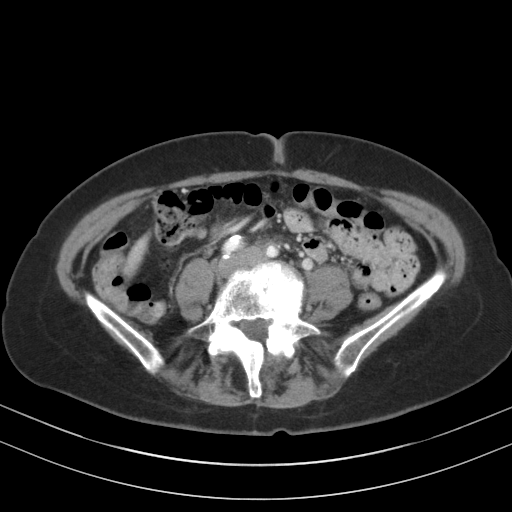
[im 49/94  soft-tissue]
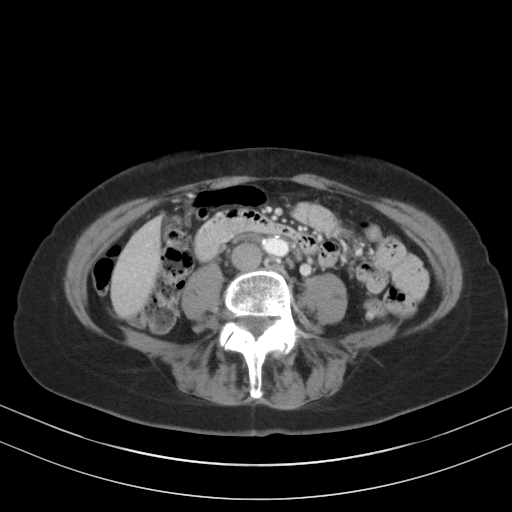
[im 59/94  soft-tissue]
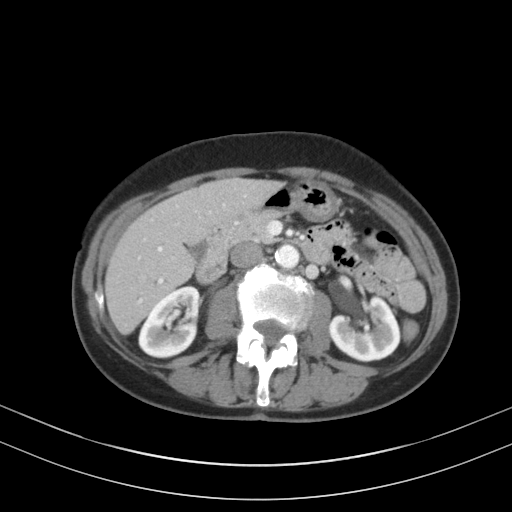
[im 64/94  soft-tissue]
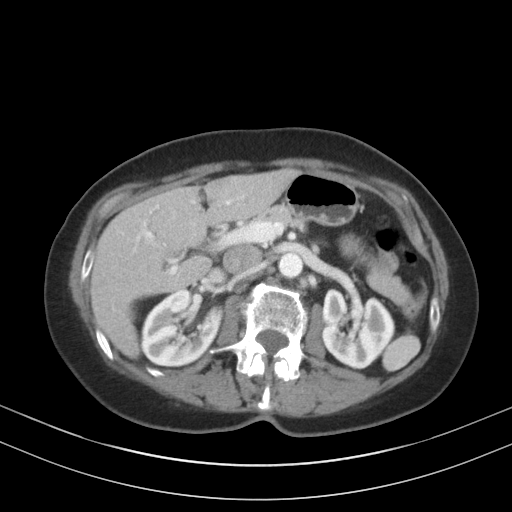
[im 64/94  bone]
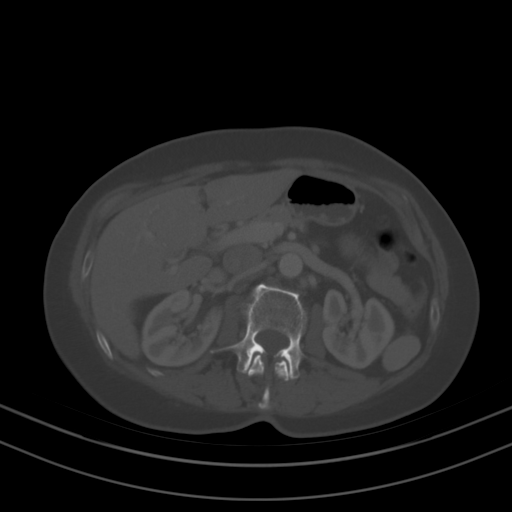
[im 74/94  soft-tissue]
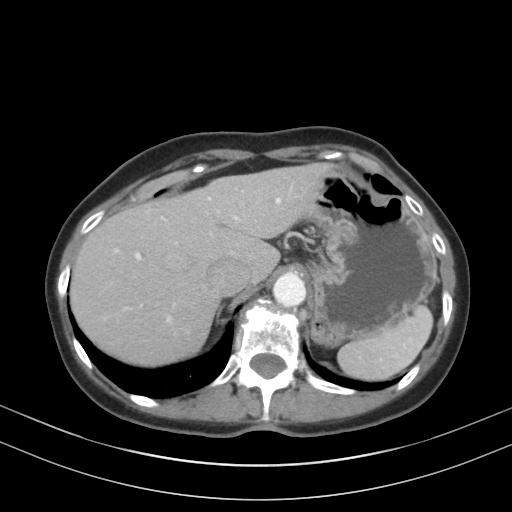
[im 79/94  soft-tissue]
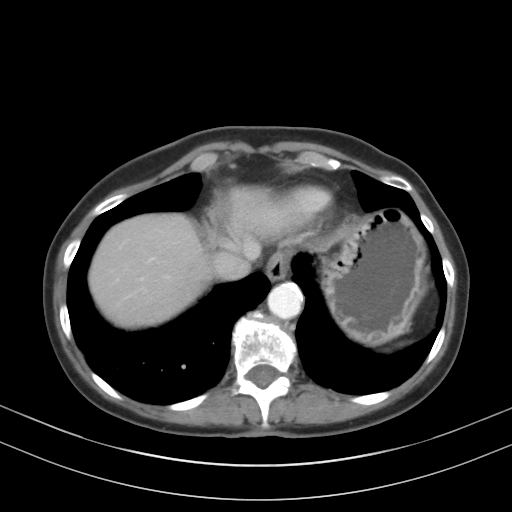
[im 89/94  soft-tissue]
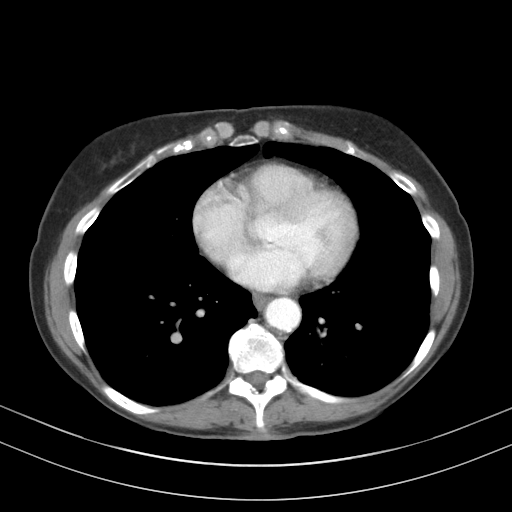

[Series 3: cor · coronal · 0.71mm/px · 3 of 73 slices shown]
[im 25/73  soft-tissue]
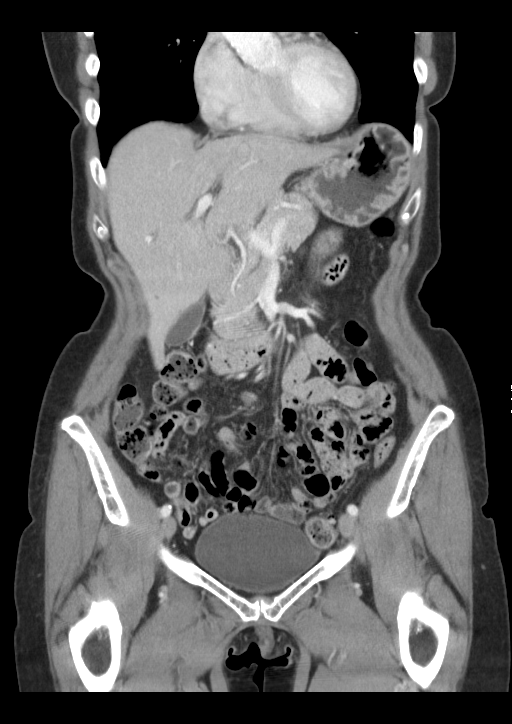
[im 33/73  soft-tissue]
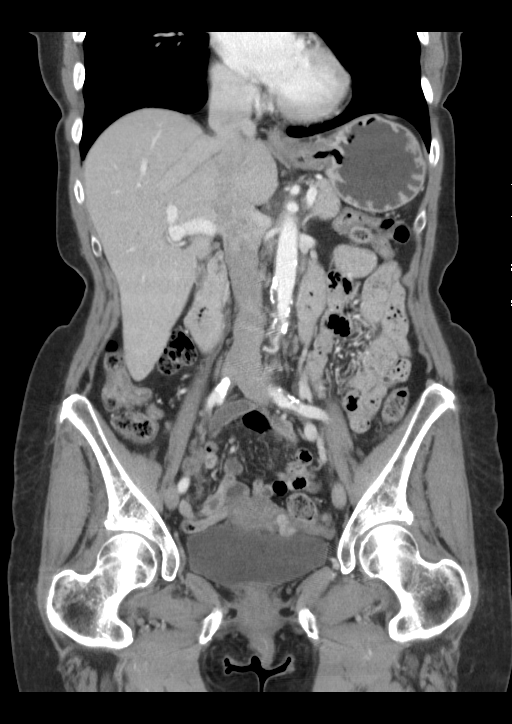
[im 41/73  soft-tissue]
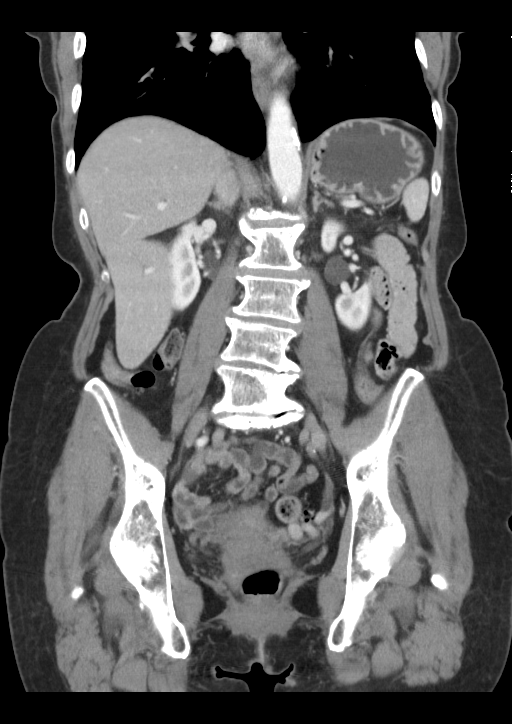

[15 of 46 positions shown; findings below may reference images not displayed]

FINDINGS: Lower chest: Flattened nodule along the inferior aspect of the RIGHT
horizontal fissure measures 7 mm image 3 series 5. This has a benign
benign morphology and may represent a intrafissural lymph node.

Hepatobiliary: There is mild intrahepatic biliary duct dilatation.
Gallbladder is normal. Common bile duct is normal caliber.
Geographic lesion along the falciform ligament measuring 2.7 cm
(image 29, series 2) which is hypo intense to the liver parenchyma.

Pancreas: Pancreas is normal. No ductal dilatation. No pancreatic
inflammation.

Spleen: Normal spleen

Adrenals/urinary tract: Adrenal glands and kidneys are normal. The
ureters and bladder normal.

Stomach/Bowel: Stomach, small bowel, appendix, and cecum are normal.
Several diverticula of the sigmoid colon without acute inflammation.
Rectum normal.

Vascular/Lymphatic: Abdominal aorta is normal caliber with
atherosclerotic calcification. There is no retroperitoneal or
periportal lymphadenopathy. No pelvic lymphadenopathy.

Reproductive: Uterus are normal. Prominent a venous vasculature
along the LEFT broad ligament. Adnexa normal.

Other: No free fluid.

Musculoskeletal: No aggressive osseous lesion.
IMPRESSION: 1. Mild intrahepatic duct dilatation and geographic lesion in the
inferior LEFT hepatic lobe. Recommend MRI abdomen with without
contrast for further evaluation.
2. Mild sigmoid diverticulosis without diverticulitis.
3.  Atherosclerotic calcification of the abdominal aorta.
4. Prominent parametrial venous vasculature along LEFT broad
ligament.
5. Nodule along the RIGHT horizontal fissure may represent
intrafissural lymph node ; however, in every day smoker recommend
follow-up per Fleischner criteria. If the patient is at high risk
for bronchogenic carcinoma, follow-up chest CT at 6-12 months is
recommended. If the patient is at low risk for bronchogenic
carcinoma, follow-up chest CT at 12 months is recommended. This
recommendation follows the consensus statement: Guidelines for
Management of Small Pulmonary Nodules Detected on CT Scans: A
Statement from the [HOSPITAL] as published in Radiology

## 2017-06-28 DIAGNOSIS — I10 Essential (primary) hypertension: Secondary | ICD-10-CM | POA: Diagnosis not present

## 2017-08-02 DIAGNOSIS — Z23 Encounter for immunization: Secondary | ICD-10-CM | POA: Diagnosis not present

## 2017-08-02 DIAGNOSIS — W540XXA Bitten by dog, initial encounter: Secondary | ICD-10-CM | POA: Diagnosis not present

## 2017-08-02 DIAGNOSIS — S81801A Unspecified open wound, right lower leg, initial encounter: Secondary | ICD-10-CM | POA: Diagnosis not present

## 2017-12-26 DIAGNOSIS — Z23 Encounter for immunization: Secondary | ICD-10-CM | POA: Diagnosis not present

## 2017-12-26 DIAGNOSIS — I10 Essential (primary) hypertension: Secondary | ICD-10-CM | POA: Diagnosis not present

## 2017-12-26 DIAGNOSIS — Z136 Encounter for screening for cardiovascular disorders: Secondary | ICD-10-CM | POA: Diagnosis not present

## 2017-12-26 DIAGNOSIS — N183 Chronic kidney disease, stage 3 (moderate): Secondary | ICD-10-CM | POA: Diagnosis not present

## 2017-12-26 DIAGNOSIS — K219 Gastro-esophageal reflux disease without esophagitis: Secondary | ICD-10-CM | POA: Diagnosis not present

## 2017-12-26 DIAGNOSIS — Z0000 Encounter for general adult medical examination without abnormal findings: Secondary | ICD-10-CM | POA: Diagnosis not present

## 2017-12-26 DIAGNOSIS — I129 Hypertensive chronic kidney disease with stage 1 through stage 4 chronic kidney disease, or unspecified chronic kidney disease: Secondary | ICD-10-CM | POA: Diagnosis not present

## 2018-01-07 DIAGNOSIS — H01024 Squamous blepharitis left upper eyelid: Secondary | ICD-10-CM | POA: Diagnosis not present

## 2018-06-21 DIAGNOSIS — L309 Dermatitis, unspecified: Secondary | ICD-10-CM | POA: Diagnosis not present

## 2018-06-26 DIAGNOSIS — M5416 Radiculopathy, lumbar region: Secondary | ICD-10-CM | POA: Diagnosis not present

## 2019-01-21 DIAGNOSIS — I129 Hypertensive chronic kidney disease with stage 1 through stage 4 chronic kidney disease, or unspecified chronic kidney disease: Secondary | ICD-10-CM | POA: Diagnosis not present

## 2019-01-21 DIAGNOSIS — Z0000 Encounter for general adult medical examination without abnormal findings: Secondary | ICD-10-CM | POA: Diagnosis not present

## 2019-01-21 DIAGNOSIS — K219 Gastro-esophageal reflux disease without esophagitis: Secondary | ICD-10-CM | POA: Diagnosis not present

## 2019-01-21 DIAGNOSIS — N183 Chronic kidney disease, stage 3 (moderate): Secondary | ICD-10-CM | POA: Diagnosis not present

## 2019-01-21 DIAGNOSIS — Z6821 Body mass index (BMI) 21.0-21.9, adult: Secondary | ICD-10-CM | POA: Diagnosis not present

## 2019-01-21 DIAGNOSIS — Z1211 Encounter for screening for malignant neoplasm of colon: Secondary | ICD-10-CM | POA: Diagnosis not present

## 2019-01-21 DIAGNOSIS — L309 Dermatitis, unspecified: Secondary | ICD-10-CM | POA: Diagnosis not present

## 2019-02-17 DIAGNOSIS — I10 Essential (primary) hypertension: Secondary | ICD-10-CM | POA: Diagnosis not present

## 2020-01-21 DIAGNOSIS — I10 Essential (primary) hypertension: Secondary | ICD-10-CM | POA: Diagnosis not present

## 2020-01-21 DIAGNOSIS — Z0000 Encounter for general adult medical examination without abnormal findings: Secondary | ICD-10-CM | POA: Diagnosis not present

## 2020-01-21 DIAGNOSIS — Z79899 Other long term (current) drug therapy: Secondary | ICD-10-CM | POA: Diagnosis not present

## 2020-03-25 DIAGNOSIS — L237 Allergic contact dermatitis due to plants, except food: Secondary | ICD-10-CM | POA: Diagnosis not present
